# Patient Record
Sex: Male | Born: 1955 | Race: White | Hispanic: No | Marital: Married | Smoking: Former smoker
Health system: Southern US, Community
[De-identification: ages and names within clinical notes are randomized; demographics above are authoritative.]

## PROBLEM LIST (undated history)

## (undated) DIAGNOSIS — Z972 Presence of dental prosthetic device (complete) (partial): Secondary | ICD-10-CM

## (undated) DIAGNOSIS — E785 Hyperlipidemia, unspecified: Secondary | ICD-10-CM

## (undated) DIAGNOSIS — K219 Gastro-esophageal reflux disease without esophagitis: Secondary | ICD-10-CM

## (undated) DIAGNOSIS — Z973 Presence of spectacles and contact lenses: Secondary | ICD-10-CM

## (undated) DIAGNOSIS — Z8739 Personal history of other diseases of the musculoskeletal system and connective tissue: Secondary | ICD-10-CM

## (undated) HISTORY — DX: Presence of spectacles and contact lenses: Z97.3

## (undated) HISTORY — DX: Personal history of other diseases of the musculoskeletal system and connective tissue: Z87.39

## (undated) HISTORY — DX: Gastro-esophageal reflux disease without esophagitis: K21.9

## (undated) HISTORY — PX: NASAL SINUS SURGERY: SHX719

## (undated) HISTORY — DX: Presence of dental prosthetic device (complete) (partial): Z97.2

## (undated) HISTORY — PX: COLONOSCOPY: SHX174

## (undated) HISTORY — DX: Hyperlipidemia, unspecified: E78.5

---

## 2007-07-31 DIAGNOSIS — Z8739 Personal history of other diseases of the musculoskeletal system and connective tissue: Secondary | ICD-10-CM

## 2007-07-31 HISTORY — DX: Personal history of other diseases of the musculoskeletal system and connective tissue: Z87.39

## 2007-07-31 HISTORY — PX: LEG SURGERY: SHX1003

## 2012-06-20 ENCOUNTER — Encounter: Payer: Self-pay | Admitting: Medical

## 2012-06-20 ENCOUNTER — Ambulatory Visit (INDEPENDENT_AMBULATORY_CARE_PROVIDER_SITE_OTHER): Payer: BC Managed Care – PPO | Admitting: Medical

## 2012-06-20 VITALS — BP 120/80 | HR 84 | Temp 98.2°F | Resp 16 | Ht 68.0 in | Wt 185.0 lb

## 2012-06-20 DIAGNOSIS — J069 Acute upper respiratory infection, unspecified: Secondary | ICD-10-CM

## 2012-06-20 DIAGNOSIS — M25569 Pain in unspecified knee: Secondary | ICD-10-CM

## 2012-06-20 DIAGNOSIS — M25562 Pain in left knee: Secondary | ICD-10-CM

## 2012-06-20 NOTE — Progress Notes (Signed)
Subjective: Here as a new patient.  Moved here from [place] few years ago.  Originally from [place].    Here for c/o head congestion.  Was up in [place] visiting friends last week, and now in the last 4-5 days having head congestion, ear pressure, some yellow phlegm productive.  Dry cough at night, feels a little ill, some chest heaviness.  He notes hx/o sinus infections in remote past, but ultimately had sinus surgery, nasal polyps removed.  He was outside over the weekend cutting some wood.  Not sure if he has sinus infection or allergies.  He denies fever, NVD, SOB, wheezing, sneezing, runny nose.  Using Benadryl every 6 hours.   He also reports left knee pain.  He is active in [place] and [place].  Denies injury, but started having pain 1 mo ago.   Has seen local orthopedic recently, had xray.  He isn't sure what the diagnosis was, but was told that he may need MRI.  No specific instruction given.   Still having pain.  Past Medical History  Diagnosis Date  . GERD (gastroesophageal reflux disease)   . H/O necrotizing fascIItis 2009    right upper thigh   ROS as in HPI    Objective:   Physical Exam  Filed Vitals:   06/20/12 1357  BP: 120/80  Pulse: 84  Temp: 98.2 F (36.8 C)  Resp: 16    General appearance: alert, no distress, WD/WN HEENT: normocephalic, sclerae anicteric, right TM with serous fluid behind TM, left TM normal, nares with erythema and mild turbinate edema, pharynx with mild erythema Oral cavity: MMM, no lesions Neck: supple, no lymphadenopathy, no thyromegaly, no masses Heart: RRR, normal S1, S2, no murmurs Lungs: CTA bilaterally, no wheezes, rhonchi, or rales MSK: left knee tender along lateral superior knee, mild grinding with ROM, no obvious joint laxity, no swelling, no warmth, otherwise nontender, rest of LE unremarkable Neurovascularly intact   Assessment and Plan :    Encounter Diagnoses  Name Primary?  . Viral URI Yes  . Knee pain, left    Viral  URI - discussed supportive care, nasal saline, rest, hydrate well, can use Mucinex DM or Zyrtec/ benadryl but just once daily.  If not much improved by Monday, call back  Knee pain - possible patellofemoral syndrome vs other etiology.   discussed home therapy exercises.  Can use OTC fish oil or glucosamine.  If not improving in 2-3 wk, f/u with orthopedics.

## 2012-10-13 ENCOUNTER — Telehealth: Payer: Self-pay | Admitting: Family Medicine

## 2012-10-13 ENCOUNTER — Encounter: Payer: Self-pay | Admitting: Gastroenterology

## 2012-10-13 ENCOUNTER — Encounter: Payer: Self-pay | Admitting: Medical

## 2012-10-13 ENCOUNTER — Ambulatory Visit: Payer: BC Managed Care – PPO | Admitting: Medical

## 2012-10-13 VITALS — BP 120/70 | HR 68 | Temp 98.0°F | Resp 16 | Ht 68.0 in | Wt 190.0 lb

## 2012-10-13 DIAGNOSIS — Z8052 Family history of malignant neoplasm of bladder: Secondary | ICD-10-CM

## 2012-10-13 DIAGNOSIS — Z1211 Encounter for screening for malignant neoplasm of colon: Secondary | ICD-10-CM

## 2012-10-13 DIAGNOSIS — Z139 Encounter for screening, unspecified: Secondary | ICD-10-CM

## 2012-10-13 DIAGNOSIS — Z0000 Encounter for general adult medical examination without abnormal findings: Secondary | ICD-10-CM

## 2012-10-13 DIAGNOSIS — Z111 Encounter for screening for respiratory tuberculosis: Secondary | ICD-10-CM

## 2012-10-13 DIAGNOSIS — Z23 Encounter for immunization: Secondary | ICD-10-CM

## 2012-10-13 LAB — POCT URINALYSIS DIPSTICK
Bilirubin, UA: NEGATIVE
Blood, UA: NEGATIVE
Nitrite, UA: NEGATIVE
pH, UA: 5

## 2012-10-13 NOTE — Progress Notes (Signed)
Subjective:   HPI  Jared Coffey is a 57 y.o. male who presents for a complete physical and needs form completed.  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  Born in [place], but moved age 70yo to [place].  Lived in [place] for years.  Will be working at [employer].    Preventative care: Last ophthalmology visit: unsure Last dental visit:n/a  Last colonoscopy:unsure Last prostate exam: 2 years ago  Last EKG: never Last labs:n/a  Prior vaccinations: TD or Tdap:unsure Influenza:no/ never Pneumococcal:n/a Shingles/Zostavax:n/a  Advanced directive:n/a Health care power of attorney:n/a Living will:n/a  Concerns: none  Reviewed their medical, surgical, family, social, medication, and allergy history and updated chart as appropriate.   Past Medical History  Diagnosis Date  . GERD (gastroesophageal reflux disease)   . H/O necrotizing fascIItis 2009    right upper thigh    Past Surgical History  Procedure Laterality Date  . Leg surgery  2009    right upper leg, plantar fascitis  . Nasal sinus surgery      age 41yo    Family History  Problem Relation Age of Onset  . Stroke Mother   . Dementia Mother     vascular  . Cancer Father 50    died of bladder cancer  . Osteoporosis Sister   . Diabetes Neg Hx   . Heart disease Neg Hx   . Hypertension Neg Hx     History   Social History  . Marital Status: Married    Spouse Name: N/A    Number of Children: N/A  . Years of Education: N/A   Occupational History  . Not on file.   Social History Main Topics  . Smoking status: Former Smoker -- 0.50 packs/day for 15 years    Start date: 06/20/2004  . Smokeless tobacco: Not on file  . Alcohol Use: No  . Drug Use: No  . Sexually Active: Not on file   Other Topics Concern  . Not on file   Social History Narrative   Married, 2 children from ex wife,  exercise - 5 days per week, 40-50 minutes, kung fu, weight training.  Walk nightly.       No current outpatient prescriptions on file prior to visit.   No current facility-administered medications on file prior to visit.    Allergies  Allergen Reactions  . Penicillins      Review of Systems Constitutional: -fever, -chills, -sweats, -unexpected weight change, -decreased appetite, -fatigue Allergy: -sneezing, -itching, -congestion Dermatology: -changing moles, --rash, -lumps ENT: -runny nose, -ear pain, -sore throat, -hoarseness, -sinus pain, -teeth pain, - ringing in ears, -hearing loss, -nosebleeds Cardiology: -chest pain, -palpitations, -swelling, -difficulty breathing when lying flat, -waking up short of breath Respiratory: -cough, -shortness of breath, -difficulty breathing with exercise or exertion, -wheezing, -coughing up blood Gastroenterology: -abdominal pain, -nausea, -vomiting, -diarrhea, -constipation, -blood in stool, -changes in bowel movement, -difficulty swallowing or eating Hematology: -bleeding, -bruising  Musculoskeletal: -joint aches, -muscle aches, -joint swelling, -back pain, -neck pain, -cramping, -changes in gait Ophthalmology: denies vision changes, eye redness, itching, discharge Urology: -burning with urination, -difficulty urinating, -blood in urine, -urinary frequency, -urgency, -incontinence Neurology: -headache, -weakness, -tingling, -numbness, -memory loss, -falls, -dizziness Psychology: -depressed mood, -agitation, -sleep problems     Objective:   Physical Exam  Filed Vitals:   10/13/12 1119  BP: 120/70  Pulse: 68  Temp: 98 F (36.7  C)  Resp: 16    General appearance: alert, no distress, WD/WN Skin: few scattered bening lesions, no worrisome lesions HEENT: normocephalic, conjunctiva/corneas normal, sclerae anicteric, PERRLA, EOMi, nares patent, no discharge or erythema, pharynx normal Oral cavity: MMM, tongue normal, teeth in good  repair Neck: supple, no lymphadenopathy, no thyromegaly, no masses, normal ROM, no bruits Chest: non tender, normal shape and expansion Heart: RRR, normal S1, S2, no murmurs Lungs: CTA bilaterally, no wheezes, rhonchi, or rales Abdomen: +bs, soft, non tender, non distended, no masses, no hepatomegaly, no splenomegaly, no bruits Back: non tender, normal ROM, no scoliosis Musculoskeletal: upper extremities non tender, no obvious deformity, normal ROM throughout, lower extremities non tender, no obvious deformity, normal ROM throughout Extremities: no edema, no cyanosis, no clubbing Pulses: 2+ symmetric, upper and lower extremities, normal cap refill Neurological: alert, oriented x 3, CN2-12 intact, strength normal upper extremities and lower extremities, sensation normal throughout, DTRs 2+ throughout, no cerebellar signs, gait normal Psychiatric: normal affect, behavior normal, pleasant  GU: normal male external genitalia, nontender, no masses, no hernia, no lymphadenopathy Rectal: anus normal, prostate mildly enlarged, no nodules, occult negative stool   Assessment and Plan :    Encounter Diagnoses  Name Primary?  . Routine general medical examination at a health care facility Yes  . Need for Tdap vaccination   . Family history of bladder cancer   . Screening for condition   . Special screening for malignant neoplasms, colon      Physical exam - discussed healthy lifestyle, diet, exercise, preventative care, vaccinations, and addressed their concerns.  He will return fasting tomorrow for labs.  PPD placed.  No restrictions regarding employment.    Tdap vaccine, VIS and counseling given  Family history of bladder cancer.  Urinalysis today normal.  Titers for Hep B and MMR.  Will refer for 1st screening colonoscopy.  Follow-up pending labs

## 2012-10-13 NOTE — Telephone Encounter (Signed)
Patient is aware of his appointment at [facility] on May 1st,14 @ 1130 am. Nurse visit on 11/13/12. CLS [phone]

## 2012-10-13 NOTE — Patient Instructions (Signed)
Preventative Care for Adults, Male       REGULAR HEALTH EXAMS:  A routine yearly physical is a good way to check in with your primary care provider about your health and preventive screening. It is also an opportunity to share updates about your health and any concerns you have, and receive a thorough all-over exam.   Most health insurance companies pay for at least some preventative services.  Check with your health plan for specific coverages.  WHAT PREVENTATIVE SERVICES DO MEN NEED?  Adult men should have their weight and blood pressure checked regularly.   Men age 35 and older should have their cholesterol levels checked regularly.  Beginning at age 50 and continuing to age 75, men should be screened for colorectal cancer.  Certain people should may need continued testing until age 85.  Other cancer screening may include exams for testicular and prostate cancer.  Updating vaccinations is part of preventative care.  Vaccinations help protect against diseases such as the flu.  Lab tests are generally done as part of preventative care to screen for anemia and blood disorders, to screen for problems with the kidneys and liver, to screen for bladder problems, to check blood sugar, and to check your cholesterol level.  Preventative services generally include counseling about diet, exercise, avoiding tobacco, drugs, excessive alcohol consumption, and sexually transmitted infections.    GENERAL RECOMMENDATIONS FOR GOOD HEALTH:  Healthy diet:  Eat a variety of foods, including fruit, vegetables, animal or vegetable protein, such as meat, fish, chicken, and eggs, or beans, lentils, tofu, and grains, such as rice.  Drink plenty of water daily.  Decrease saturated fat in the diet, avoid lots of red meat, processed foods, sweets, fast foods, and fried foods.  Exercise:  Aerobic exercise helps maintain good heart health. At least 30-40 minutes of moderate-intensity exercise is recommended.  For example, a brisk walk that increases your heart rate and breathing. This should be done on most days of the week.   Find a type of exercise or a variety of exercises that you enjoy so that it becomes a part of your daily life.  Examples are running, walking, swimming, water aerobics, and biking.  For motivation and support, explore group exercise such as aerobic class, spin class, Zumba, Yoga,or  martial arts, etc.    Set exercise goals for yourself, such as a certain weight goal, walk or run in a race such as a 5k walk/run.  Speak to your primary care provider about exercise goals.  Disease prevention:  If you smoke or chew tobacco, find out from your caregiver how to quit. It can literally save your life, no matter how long you have been a tobacco user. If you do not use tobacco, never begin.   Maintain a healthy diet and normal weight. Increased weight leads to problems with blood pressure and diabetes.   The Body Mass Index or BMI is a way of measuring how much of your body is fat. Having a BMI above 27 increases the risk of heart disease, diabetes, hypertension, stroke and other problems related to obesity. Your caregiver can help determine your BMI and based on it develop an exercise and dietary program to help you achieve or maintain this important measurement at a healthful level.  High blood pressure causes heart and blood vessel problems.  Persistent high blood pressure should be treated with medicine if weight loss and exercise do not work.   Fat and cholesterol leaves deposits in your arteries   that can block them. This causes heart disease and vessel disease elsewhere in your body.  If your cholesterol is found to be high, or if you have heart disease or certain other medical conditions, then you may need to have your cholesterol monitored frequently and be treated with medication.   Ask if you should have a stress test if your history suggests this. A stress test is a test done on  a treadmill that looks for heart disease. This test can find disease prior to there being a problem.  Avoid drinking alcohol in excess (more than two drinks per day).  Avoid use of street drugs. Do not share needles with anyone. Ask for professional help if you need assistance or instructions on stopping the use of alcohol, cigarettes, and/or drugs.  Brush your teeth twice a day with fluoride toothpaste, and floss once a day. Good oral hygiene prevents tooth decay and gum disease. The problems can be painful, unattractive, and can cause other health problems. Visit your dentist for a routine oral and dental check up and preventive care every 6-12 months.   Look at your skin regularly.  Use a mirror to look at your back. Notify your caregivers of changes in moles, especially if there are changes in shapes, colors, a size larger than a pencil eraser, an irregular border, or development of new moles.  Safety:  Use seatbelts 100% of the time, whether driving or as a passenger.  Use safety devices such as hearing protection if you work in environments with loud noise or significant background noise.  Use safety glasses when doing any work that could send debris in to the eyes.  Use a helmet if you ride a bike or motorcycle.  Use appropriate safety gear for contact sports.  Talk to your caregiver about gun safety.  Use sunscreen with a SPF (or skin protection factor) of 15 or greater.  Lighter skinned people are at a greater risk of skin cancer. Don't forget to also wear sunglasses in order to protect your eyes from too much damaging sunlight. Damaging sunlight can accelerate cataract formation.   Practice safe sex. Use condoms. Condoms are used for birth control and to help reduce the spread of sexually transmitted infections (or STIs).  Some of the STIs are gonorrhea (the clap), chlamydia, syphilis, trichomonas, herpes, HPV (human papilloma virus) and HIV (human immunodeficiency virus) which causes AIDS.  The herpes, HIV and HPV are viral illnesses that have no cure. These can result in disability, cancer and death.   Keep carbon monoxide and smoke detectors in your home functioning at all times. Change the batteries every 6 months or use a model that plugs into the wall.   Vaccinations:  Stay up to date with your tetanus shots and other required immunizations. You should have a booster for tetanus every 10 years. Be sure to get your flu shot every year, since 5%-20% of the [place] population comes down with the flu. The flu vaccine changes each year, so being vaccinated once is not enough. Get your shot in the fall, before the flu season peaks.   Other vaccines to consider:  Pneumococcal vaccine to protect against certain types of pneumonia.  This is normally recommended for adults age 65 or older.  However, adults younger than 58 years old with certain underlying conditions such as diabetes, heart or lung disease should also receive the vaccine.  Shingles vaccine to protect against Varicella Zoster if you are older than age 60, or younger   than 58 years old with certain underlying illness.  Hepatitis A vaccine to protect against a form of infection of the liver by a virus acquired from food.  Hepatitis B vaccine to protect against a form of infection of the liver by a virus acquired from blood or body fluids, particularly if you work in health care.  If you plan to travel internationally, check with your local health department for specific vaccination recommendations.  Cancer Screening:  Most routine colon cancer screening begins at the age of 50. On a yearly basis, doctors may provide special easy to use take-home tests to check for hidden blood in the stool. Sigmoidoscopy or colonoscopy can detect the earliest forms of colon cancer and is life saving. These tests use a small camera at the end of a tube to directly examine the colon. Speak to your caregiver about this at age 50, when routine  screening begins (and is repeated every 5 years unless early forms of pre-cancerous polyps or small growths are found).   At the age of 50 men usually start screening for prostate cancer every year. Screening may begin at a younger age for those with higher risk. Those at higher risk include African-Americans or having a family history of prostate cancer. There are two types of tests for prostate cancer:   Prostate-specific antigen (PSA) testing. Recent studies raise questions about prostate cancer using PSA and you should discuss this with your caregiver.   Digital rectal exam (in which your doctor's lubricated and gloved finger feels for enlargement of the prostate through the anus).   Screening for testicular cancer.  Do a monthly exam of your testicles. Gently roll each testicle between your thumb and fingers, feeling for any abnormal lumps. The best time to do this is after a hot shower or bath when the tissues are looser. Notify your caregivers of any lumps, tenderness or changes in size or shape immediately.     

## 2012-10-15 ENCOUNTER — Other Ambulatory Visit: Payer: BC Managed Care – PPO

## 2012-10-15 DIAGNOSIS — Z139 Encounter for screening, unspecified: Secondary | ICD-10-CM

## 2012-10-15 DIAGNOSIS — Z0000 Encounter for general adult medical examination without abnormal findings: Secondary | ICD-10-CM

## 2012-10-15 LAB — COMPREHENSIVE METABOLIC PANEL
ALT: 25 U/L (ref 0–53)
Albumin: 4.3 g/dL (ref 3.5–5.2)
CO2: 28 mEq/L (ref 19–32)
Chloride: 105 mEq/L (ref 96–112)
Potassium: 4.3 mEq/L (ref 3.5–5.3)
Sodium: 141 mEq/L (ref 135–145)
Total Bilirubin: 0.5 mg/dL (ref 0.3–1.2)
Total Protein: 6.4 g/dL (ref 6.0–8.3)

## 2012-10-15 LAB — CBC WITH DIFFERENTIAL/PLATELET
Basophils Absolute: 0 10*3/uL (ref 0.0–0.1)
Eosinophils Absolute: 0.3 10*3/uL (ref 0.0–0.7)
Eosinophils Relative: 4 % (ref 0–5)
Lymphocytes Relative: 29 % (ref 12–46)
MCV: 84.3 fL (ref 78.0–100.0)
Platelets: 353 10*3/uL (ref 150–400)
RDW: 13.5 % (ref 11.5–15.5)
WBC: 6.3 10*3/uL (ref 4.0–10.5)

## 2012-10-16 LAB — HEPATITIS B SURFACE ANTIBODY, QUANTITATIVE: Hepatitis B-Post: 0.2 m[IU]/mL

## 2012-10-16 LAB — MEASLES/MUMPS/RUBELLA IMMUNITY: Rubella: 3.71 Index — ABNORMAL HIGH (ref <0.90)

## 2012-10-22 ENCOUNTER — Other Ambulatory Visit: Payer: Self-pay | Admitting: Medical

## 2012-10-22 MED ORDER — ATORVASTATIN CALCIUM 20 MG PO TABS: 20.0000 mg | ORAL_TABLET | Freq: Every day | ORAL | Status: DC

## 2012-10-23 ENCOUNTER — Telehealth: Payer: Self-pay | Admitting: Medical

## 2012-10-23 NOTE — Telephone Encounter (Signed)
LM

## 2012-10-28 ENCOUNTER — Other Ambulatory Visit (INDEPENDENT_AMBULATORY_CARE_PROVIDER_SITE_OTHER): Payer: BC Managed Care – PPO

## 2012-10-28 DIAGNOSIS — Z111 Encounter for screening for respiratory tuberculosis: Secondary | ICD-10-CM

## 2012-10-28 DIAGNOSIS — Z23 Encounter for immunization: Secondary | ICD-10-CM

## 2012-10-30 LAB — TB SKIN TEST
Induration: 0 mm
TB Skin Test: NEGATIVE

## 2012-10-30 NOTE — Addendum Note (Signed)
Addended by: Lavell Islam on: 10/30/2012 11:35 AM   Modules accepted: Orders

## 2012-11-13 ENCOUNTER — Encounter (HOSPITAL_COMMUNITY): Payer: Self-pay | Admitting: Emergency Medicine

## 2012-11-13 ENCOUNTER — Emergency Department (HOSPITAL_COMMUNITY)
Admission: EM | Admit: 2012-11-13 | Discharge: 2012-11-13 | Disposition: A | Discharge status: Home / Self Care | Payer: BC Managed Care – PPO | Attending: Emergency Medicine | Admitting: Emergency Medicine

## 2012-11-13 ENCOUNTER — Emergency Department (HOSPITAL_COMMUNITY): Payer: BC Managed Care – PPO

## 2012-11-13 DIAGNOSIS — Z7982 Long term (current) use of aspirin: Secondary | ICD-10-CM | POA: Insufficient documentation

## 2012-11-13 DIAGNOSIS — Z8739 Personal history of other diseases of the musculoskeletal system and connective tissue: Secondary | ICD-10-CM | POA: Insufficient documentation

## 2012-11-13 DIAGNOSIS — Z87891 Personal history of nicotine dependence: Secondary | ICD-10-CM | POA: Insufficient documentation

## 2012-11-13 DIAGNOSIS — F4321 Adjustment disorder with depressed mood: Secondary | ICD-10-CM | POA: Insufficient documentation

## 2012-11-13 DIAGNOSIS — K219 Gastro-esophageal reflux disease without esophagitis: Secondary | ICD-10-CM | POA: Insufficient documentation

## 2012-11-13 DIAGNOSIS — IMO0002 Reserved for concepts with insufficient information to code with codable children: Secondary | ICD-10-CM | POA: Insufficient documentation

## 2012-11-13 DIAGNOSIS — Y929 Unspecified place or not applicable: Secondary | ICD-10-CM | POA: Insufficient documentation

## 2012-11-13 DIAGNOSIS — R296 Repeated falls: Secondary | ICD-10-CM | POA: Insufficient documentation

## 2012-11-13 DIAGNOSIS — T148XXA Other injury of unspecified body region, initial encounter: Secondary | ICD-10-CM

## 2012-11-13 DIAGNOSIS — S161XXA Strain of muscle, fascia and tendon at neck level, initial encounter: Secondary | ICD-10-CM

## 2012-11-13 DIAGNOSIS — Z79899 Other long term (current) drug therapy: Secondary | ICD-10-CM | POA: Insufficient documentation

## 2012-11-13 DIAGNOSIS — Y939 Activity, unspecified: Secondary | ICD-10-CM | POA: Insufficient documentation

## 2012-11-13 DIAGNOSIS — S139XXA Sprain of joints and ligaments of unspecified parts of neck, initial encounter: Secondary | ICD-10-CM | POA: Insufficient documentation

## 2012-11-13 DIAGNOSIS — G40909 Epilepsy, unspecified, not intractable, without status epilepticus: Secondary | ICD-10-CM | POA: Insufficient documentation

## 2012-11-13 MED ORDER — BACITRACIN 500 UNIT/GM EX OINT
1.0000 "application " | TOPICAL_OINTMENT | Freq: Two times a day (BID) | CUTANEOUS | Status: DC
  Administered 2012-11-13: 1 via TOPICAL
  Filled 2012-11-13: qty 0.9
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
## 2012-11-17 ENCOUNTER — Telehealth: Payer: Self-pay | Admitting: Family Medicine

## 2012-11-17 NOTE — Telephone Encounter (Signed)
I called and left a message for the patient about calling and scheduling a ED follow up appointment. CLS

## 2012-11-27 ENCOUNTER — Other Ambulatory Visit: Payer: BC Managed Care – PPO

## 2012-11-27 ENCOUNTER — Encounter: Payer: BC Managed Care – PPO | Admitting: Gastroenterology

## 2012-11-27 ENCOUNTER — Other Ambulatory Visit (INDEPENDENT_AMBULATORY_CARE_PROVIDER_SITE_OTHER): Payer: BC Managed Care – PPO

## 2012-11-27 DIAGNOSIS — Z23 Encounter for immunization: Secondary | ICD-10-CM

## 2013-04-30 ENCOUNTER — Other Ambulatory Visit (INDEPENDENT_AMBULATORY_CARE_PROVIDER_SITE_OTHER): Payer: BC Managed Care – PPO

## 2013-04-30 DIAGNOSIS — Z23 Encounter for immunization: Secondary | ICD-10-CM

## 2013-06-03 ENCOUNTER — Ambulatory Visit: Payer: BC Managed Care – PPO | Admitting: Medical

## 2013-06-04 ENCOUNTER — Ambulatory Visit (INDEPENDENT_AMBULATORY_CARE_PROVIDER_SITE_OTHER): Payer: BC Managed Care – PPO | Admitting: Family Medicine

## 2013-06-04 VITALS — BP 128/78 | HR 57 | Wt 178.0 lb

## 2013-06-04 DIAGNOSIS — L309 Dermatitis, unspecified: Secondary | ICD-10-CM

## 2013-06-04 DIAGNOSIS — L259 Unspecified contact dermatitis, unspecified cause: Secondary | ICD-10-CM

## 2013-06-04 MED ORDER — TRIAMCINOLONE ACETONIDE 0.5 % EX CREA: 1.0000 "application " | TOPICAL_CREAM | Freq: Three times a day (TID) | CUTANEOUS | Status: DC

## 2013-06-04 NOTE — Progress Notes (Signed)
  Subjective:    Patient ID: Jared Coffey, male    DOB: 1955/09/23, 57 y.o.   MRN: [id number]  HPI He is here for evaluation of a rash and itching in the mid posterior back area that he has had for the last 15 months. It does tend to come and go. There is a correlation to when he showers and has difficulty with the itching. He notices it in no area other than his back. He did state he was given a patch which helped. He showed it to me however there are no markings on that.   Review of Systems     Objective:   Physical Exam Alert and in no distress. Exam of the back shows slight ecchymosis or he recently scratched the area but the skin otherwise appears normal      Assessment & Plan:  Dermatitis - Plan: triamcinolone cream (KENALOG) 0.5 %  the patch is probably a cortisone preparation. Recommend he use a patch of her try Kenalog. He has further difficulty I will refer to dermatology.

## 2014-05-25 ENCOUNTER — Ambulatory Visit (INDEPENDENT_AMBULATORY_CARE_PROVIDER_SITE_OTHER): Payer: BC Managed Care – PPO | Admitting: Medical

## 2014-05-25 ENCOUNTER — Encounter: Payer: Self-pay | Admitting: Internal Medicine

## 2014-05-25 ENCOUNTER — Encounter: Payer: Self-pay | Admitting: Medical

## 2014-05-25 VITALS — BP 100/60 | HR 60 | Temp 98.2°F | Resp 14 | Ht 68.0 in | Wt 176.0 lb

## 2014-05-25 DIAGNOSIS — Z0000 Encounter for general adult medical examination without abnormal findings: Secondary | ICD-10-CM

## 2014-05-25 DIAGNOSIS — Z23 Encounter for immunization: Secondary | ICD-10-CM

## 2014-05-25 DIAGNOSIS — Z1211 Encounter for screening for malignant neoplasm of colon: Secondary | ICD-10-CM

## 2014-05-25 DIAGNOSIS — Z125 Encounter for screening for malignant neoplasm of prostate: Secondary | ICD-10-CM

## 2014-05-25 DIAGNOSIS — K219 Gastro-esophageal reflux disease without esophagitis: Secondary | ICD-10-CM

## 2014-05-25 LAB — COMPREHENSIVE METABOLIC PANEL
ALBUMIN: 4.5 g/dL (ref 3.5–5.2)
ALT: 24 U/L (ref 0–53)
AST: 22 U/L (ref 0–37)
Alkaline Phosphatase: 64 U/L (ref 39–117)
BUN: 16 mg/dL (ref 6–23)
CHLORIDE: 103 meq/L (ref 96–112)
CO2: 29 meq/L (ref 19–32)
Calcium: 9.7 mg/dL (ref 8.4–10.5)
Creat: 1.04 mg/dL (ref 0.50–1.35)
GLUCOSE: 83 mg/dL (ref 70–99)
POTASSIUM: 4.3 meq/L (ref 3.5–5.3)
SODIUM: 141 meq/L (ref 135–145)
TOTAL PROTEIN: 7.4 g/dL (ref 6.0–8.3)
Total Bilirubin: 0.8 mg/dL (ref 0.2–1.2)

## 2014-05-25 LAB — CBC
HEMATOCRIT: 44.6 % (ref 39.0–52.0)
HEMOGLOBIN: 15.6 g/dL (ref 13.0–17.0)
MCH: 30.1 pg (ref 26.0–34.0)
MCHC: 35 g/dL (ref 30.0–36.0)
MCV: 85.9 fL (ref 78.0–100.0)
Platelets: 356 10*3/uL (ref 150–400)
RBC: 5.19 MIL/uL (ref 4.22–5.81)
RDW: 13.2 % (ref 11.5–15.5)
WBC: 6.4 10*3/uL (ref 4.0–10.5)

## 2014-05-25 LAB — LIPID PANEL
CHOLESTEROL: 227 mg/dL — AB (ref 0–200)
HDL: 61 mg/dL (ref >39)
LDL Cholesterol: 137 mg/dL — ABNORMAL HIGH (ref 0–99)
TRIGLYCERIDES: 145 mg/dL (ref <150)
Total CHOL/HDL Ratio: 3.7 Ratio
VLDL: 29 mg/dL (ref 0–40)

## 2014-05-25 LAB — POCT URINALYSIS DIPSTICK
BILIRUBIN UA: NEGATIVE
Blood, UA: NEGATIVE
GLUCOSE UA: NEGATIVE
KETONES UA: NEGATIVE
LEUKOCYTES UA: NEGATIVE
Nitrite, UA: NEGATIVE
Protein, UA: NEGATIVE
Urobilinogen, UA: NEGATIVE
pH, UA: 5

## 2014-05-25 LAB — TSH: TSH: 1.734 u[IU]/mL (ref 0.350–4.500)

## 2014-05-25 NOTE — Progress Notes (Signed)
Subjective:   HPI  Jared CornwallFariborz Coffey is a 58 y.o. male who presents for a complete physical.   Preventative care: Last ophthalmology visit:yes Last dental visit: yes Last colonoscopy:never Last prostate exam: ? Last EKG:n/a Last labs: today  Prior vaccinations: TD or Tdap: 2014 Influenza: today Pneumococcal:n/a Shingles/Zostavax:n/a  Advanced directive:n/a Health care power of attorney:n/a Living will:n/a  Concerns: none  Past Medical History  Diagnosis Date  . GERD (gastroesophageal reflux disease)   . H/O necrotizing fasciitis 2009    right upper thigh  . Wears glasses   . Wears partial dentures     Past Surgical History  Procedure Laterality Date  . Leg surgery  2009    right upper leg, plantar fascitis  . Nasal sinus surgery      age 58yo  . Colonoscopy      pending 04/2014    History   Social History  . Marital Status: Married    Spouse Name: N/A    Number of Children: N/A  . Years of Education: N/A   Occupational History  . Not on file.   Social History Main Topics  . Smoking status: Former Smoker -- 0.50 packs/day for 15 years    Start date: 06/20/2004  . Smokeless tobacco: Not on file  . Alcohol Use: No  . Drug Use: No  . Sexual Activity: Not on file   Other Topics Concern  . Not on file   Social History Narrative   Married, 2 children from ex wife, exercise - 5 days per week, 40-50 minutes, kung fu, weight training.  Walk nightly.   Working as a Midwifebus driver for [employer]    Family History  Problem Relation Age of Onset  . Stroke Mother   . Dementia Mother     vascular  . Cancer Father [id number]    died of bladder cancer  . Osteoporosis Sister   . Diabetes Neg Hx   . Heart disease Neg Hx   . Hypertension Neg Hx     Current outpatient prescriptions:aspirin EC 81 MG tablet, Take 81 mg by mouth daily., Disp: , Rfl: ;  omeprazole (PRILOSEC) 10 MG capsule, Take 10 mg by mouth daily., Disp: , Rfl:   Allergies  Allergen Reactions  .  Penicillins Rash   Reviewed their medical, surgical, family, social, medication, and allergy history and updated chart as appropriate.    Review of Systems Constitutional: -fever, -chills, -sweats, -unexpected weight change, -decreased appetite, -fatigue Allergy: -sneezing, -itching, -congestion Dermatology: -changing moles, --rash, -lumps ENT: -runny nose, -ear pain, -sore throat, -hoarseness, -sinus pain, -teeth pain, - ringing in ears, -hearing loss, -nosebleeds Cardiology: -chest pain, -palpitations, -swelling, -difficulty breathing when lying flat, -waking up short of breath Respiratory: -cough, -shortness of breath, -difficulty breathing with exercise or exertion, -wheezing, -coughing up blood Gastroenterology: -abdominal pain, -nausea, -vomiting, -diarrhea, -constipation, -blood in stool, -changes in bowel movement, -difficulty swallowing or eating Hematology: -bleeding, -bruising  Musculoskeletal: -joint aches, -muscle aches, -joint swelling, -back pain, -neck pain, -cramping, -changes in gait Ophthalmology: denies vision changes, eye redness, itching, discharge Urology: -burning with urination, -difficulty urinating, -blood in urine, -urinary frequency, -urgency, -incontinence Neurology: -headache, -weakness, -tingling, -numbness, -memory loss, -falls, -dizziness Psychology: -depressed mood, -agitation, -sleep problems     Objective:   Physical Exam  BP 100/60  Pulse 60  Temp(Src) 98.2 F (36.8 C) (Oral)  Resp 14  Ht 5\' 8"  (1.727 m)  Wt 176 lb (79.833 kg)  BMI 26.77 kg/m2  General appearance: alert,  no distress, WD/WN  Skin: few scattered benign lesions, no worrisome lesions  HEENT: normocephalic, conjunctiva/corneas normal, sclerae anicteric, PERRLA, EOMi, nares patent, no discharge or erythema, pharynx normal  Oral cavity: MMM, tongue normal, teeth in good repair, partial denture present Neck: supple, no lymphadenopathy, no thyromegaly, no masses, normal ROM, no  bruits  Chest: non tender, normal shape and expansion  Heart: RRR, normal S1, S2, no murmurs  Lungs: CTA bilaterally, no wheezes, rhonchi, or rales  Abdomen: +bs, soft, non tender, non distended, no masses, no hepatomegaly, no splenomegaly, no bruits  Back: non tender, normal ROM, no scoliosis  Musculoskeletal: upper extremities non tender, no obvious deformity, normal ROM throughout, lower extremities non tender, no obvious deformity, normal ROM throughout  Extremities: no edema, no cyanosis, no clubbing  Pulses: 2+ symmetric, upper and lower extremities, normal cap refill  Neurological: medial upper right thigh with long linear surgical scar, otherwise alert, oriented x 3, CN2-12 intact, strength normal upper extremities and lower extremities, sensation normal throughout, DTRs 2+ throughout, no cerebellar signs, gait normal  Psychiatric: normal affect, behavior normal, pleasant  GU: normal male external genitalia, circumcised, left distal dorsal shaft of penis with 4mm x 2mm flat brown/black macule and smaller 1mm round similar macules distal to that unchanged for years per patient, nontender, no masses, no hernia, no lymphadenopathy  Rectal: anus normal, prostate mildly enlarged, no nodules, occult negative stool  Assessment and Plan :    Encounter Diagnoses  Name Primary?  . Encounter for health maintenance examination in adult Yes  . Gastroesophageal reflux disease without esophagitis   . Need for prophylactic vaccination and inoculation against influenza   . Screening for prostate cancer   . Special screening for malignant neoplasms, colon     Physical exam - discussed healthy lifestyle, diet, exercise, preventative care, vaccinations, and addressed their concerns.  Labs today See your eye doctor yearly for routine vision care. See your dentist yearly for routine dental care including hygiene visits twice yearly. GERD-uses PPI periodically Counseled on the influenza virus vaccine.   Vaccine information sheet given.  Influenza vaccine given after consent obtained. PSA screening today Referral for first colonoscopy screening Follow-up pending labs

## 2014-05-26 ENCOUNTER — Encounter: Payer: Self-pay | Admitting: Family Medicine

## 2014-05-26 LAB — PSA: PSA: 0.82 ng/mL (ref <=4.00)

## 2014-06-28 ENCOUNTER — Other Ambulatory Visit (INDEPENDENT_AMBULATORY_CARE_PROVIDER_SITE_OTHER): Payer: BC Managed Care – PPO

## 2014-06-28 DIAGNOSIS — Z111 Encounter for screening for respiratory tuberculosis: Secondary | ICD-10-CM

## 2014-07-02 ENCOUNTER — Encounter: Payer: Self-pay | Admitting: *Deleted

## 2014-07-02 ENCOUNTER — Ambulatory Visit (AMBULATORY_SURGERY_CENTER): Payer: Self-pay | Admitting: *Deleted

## 2014-07-02 VITALS — Ht 68.0 in | Wt 184.0 lb

## 2014-07-02 DIAGNOSIS — Z1211 Encounter for screening for malignant neoplasm of colon: Secondary | ICD-10-CM

## 2014-07-02 MED ORDER — MOVIPREP 100 G PO SOLR: 1.0000 | Freq: Once | ORAL | Status: DC

## 2014-07-02 NOTE — Progress Notes (Signed)
No egg or soy allergy. ewm No issues with past sedation. ewm No diet pills, no blood thinners. ewm No home 02 use. ewm Pt told he cannot drive the day of the procedure and he said he will just take a cab. Explained to pt he HAS to have a care partner here with him for the ENTIRE procedure and if he comes alone, we cannot do his procedure. He was also told he cannot take a cab home alone with no one with him. He was told multiple times he needs someone here, in the waiting room for 2-3 hours, that will not leave and be able to drive him home. He verbalized understanding of this. He was also confused about eating after I explained a light breakfast the day before. He thought he could have a light breakfast the day before  and the day of his procedure. Explained that the light breakfast is only the day before and the day of he gets only liquids until after his procedure. [facility] He stated that he finally understood the instructions. EM

## 2014-07-12 ENCOUNTER — Encounter: Payer: BC Managed Care – PPO | Admitting: Internal Medicine

## 2014-07-20 ENCOUNTER — Encounter: Payer: Self-pay | Admitting: Internal Medicine

## 2014-08-02 ENCOUNTER — Encounter: Payer: BC Managed Care – PPO | Admitting: Internal Medicine

## 2014-12-13 ENCOUNTER — Ambulatory Visit (INDEPENDENT_AMBULATORY_CARE_PROVIDER_SITE_OTHER): Payer: BLUE CROSS/BLUE SHIELD | Admitting: Family Medicine

## 2014-12-13 ENCOUNTER — Encounter: Payer: Self-pay | Admitting: Family Medicine

## 2014-12-13 VITALS — BP 140/70 | HR 62 | Temp 98.7°F | Wt 183.4 lb

## 2014-12-13 DIAGNOSIS — H109 Unspecified conjunctivitis: Secondary | ICD-10-CM

## 2014-12-13 DIAGNOSIS — J0100 Acute maxillary sinusitis, unspecified: Secondary | ICD-10-CM | POA: Diagnosis not present

## 2014-12-13 MED ORDER — CLARITHROMYCIN 500 MG PO TABS: 500.0000 mg | ORAL_TABLET | Freq: Two times a day (BID) | ORAL | Status: DC

## 2014-12-13 NOTE — Progress Notes (Signed)
   Subjective:    Patient ID: Jared Coffey, male    DOB: 07-Jun-1956, 59 y.o.   MRN: [id number]  HPI He complains of a six-day history of same with sinus congestion. This occurred after he did some yard work and moved some wheeze. He then developed some left eye redness and slight drainage. He continues to have difficulty with sinus congestion, PND, sore throat and slight headache.   Review of Systems     Objective:   Physical Exam Alert and in no distress. Tympanic membranes and canals are normal. Pharyngeal area is normal. Neck is supple without adenopathy or thyromegaly. Cardiac exam shows a regular sinus rhythm without murmurs or gallops. Lungs are clear to auscultation.Left eyelid is slightly swollen and the conjunctiva is injected. Nasal mucosa is red with some tenderness over maxillary sinuses        Assessment & Plan:  Conjunctivitis of left eye - Plan: clarithromycin (BIAXIN) 500 MG tablet  Acute maxillary sinusitis, recurrence not specified - Plan: clarithromycin (BIAXIN) 500 MG tablet He will call me if not entirely better when he finishes the antibiotic

## 2014-12-13 NOTE — Patient Instructions (Signed)
Take all the antibiotic and if not totally back to normal when you finish, give me a call 

## 2014-12-24 ENCOUNTER — Telehealth: Payer: Self-pay | Admitting: Family Medicine

## 2014-12-24 NOTE — Telephone Encounter (Signed)
Patient only having burning in his throat, no other symptoms. He will get some IBU and use chloraseptic spray until he sees Dr.Lalonde on Tuesday.

## 2014-12-24 NOTE — Telephone Encounter (Signed)
He was treated for a sinus infection. He did not have pharyngitis.  If only complaining of sore throat, he can try tylenol, ibuprofen, chloraseptic spray, throat lozenges. If he is having a lot of postnasal drainage, he can use cold medications to dry it up and prevent postnasal drainage. If he has thick mucus, he should be using guaifenesin (robitussin or mucinex). If he has allergies (clear runny nose, sniffling, sneezing, etc) then he should be taking antihistamine (ie claritin, zyrtec--I believe he has benadryl listed in his med list, not sure if he takes).

## 2014-12-24 NOTE — Telephone Encounter (Signed)
Pt. Called in stating the medication prescribed by Dr. Susann GivensLalonde CLARITHROMYCIN (biaxin) 500mg  tab. has not worked. Pt. states he has completed the ten day antibiotic and feels the same(throat burning). Pt. Has already made a f/u appt. With Dr. Susann GivensLalonde on Wednesday. Pt. 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   Contact # [phone]

## 2014-12-28 ENCOUNTER — Telehealth: Payer: Self-pay | Admitting: Medical

## 2014-12-28 ENCOUNTER — Other Ambulatory Visit: Payer: Self-pay | Admitting: Family Medicine

## 2014-12-28 ENCOUNTER — Encounter: Payer: Self-pay | Admitting: Medical

## 2014-12-28 ENCOUNTER — Ambulatory Visit (INDEPENDENT_AMBULATORY_CARE_PROVIDER_SITE_OTHER): Payer: BLUE CROSS/BLUE SHIELD | Admitting: Medical

## 2014-12-28 VITALS — BP 140/80 | HR 63 | Temp 98.2°F | Resp 15 | Wt 181.0 lb

## 2014-12-28 DIAGNOSIS — K121 Other forms of stomatitis: Secondary | ICD-10-CM | POA: Diagnosis not present

## 2014-12-28 DIAGNOSIS — L290 Pruritus ani: Secondary | ICD-10-CM

## 2014-12-28 DIAGNOSIS — Z1211 Encounter for screening for malignant neoplasm of colon: Secondary | ICD-10-CM

## 2014-12-28 MED ORDER — HYDROCORTISONE ACETATE 25 MG RE SUPP: 25.0000 mg | Freq: Two times a day (BID) | RECTAL | Status: DC

## 2014-12-28 MED ORDER — HYDROCORTISONE 2.5 % RE CREA: 1.0000 "application " | TOPICAL_CREAM | Freq: Two times a day (BID) | RECTAL | Status: DC

## 2014-12-28 NOTE — Telephone Encounter (Signed)
Refer again to GI for first screening colonoscopy

## 2014-12-28 NOTE — Progress Notes (Signed)
Subjective: Here for possible allergic reaction.   5 days ago ate some onion, whole mouth was burning, had blisters in mouth and throat. Couldn't eat or drink anything.  Thought this was allergic reaction.  Took benadryl and Claritin the next few days.  Everything in mouth was red.   At the same, same day, anus was itching.  Does eat a lot of tomatoes in general.  Had some beef stew along with the onions.  Denies recent fever, no blood in stool, no mucous in stool, no oily stool, no abdominal pain.  No prior similar.  improving significantly as of today, but anus still irritated and mouth ulcers not completley healed up yet.  No other aggravating or relieving factors. No other complaint.   Objective: BP 140/80 mmHg  Pulse 63  Temp(Src) 98.2 F (36.8 C) (Oral)  Resp 15  Wt 181 lb (82.101 kg)  General appearance: alert, no distress, WD/WN HEENT: normocephalic, sclerae anicteric, TMs pearly, nares patent, no discharge or erythema, pharynx normal Oral cavity: MMM, several whitish healing 2-573mm round ulcerations in oral mucosa throughout Neck: supple, no lymphadenopathy, no thyromegaly, no masses Abdomen: +bs, soft, non tender, non distended, no masses, no hepatomegaly, no splenomegaly Anus mildly inflamed appearing, no hemorrhoid, no fissure, nontender    Assessment: Encounter Diagnoses  Name Primary?  . Mouth ulcers Yes  . Rectal itching   . Special screening for malignant neoplasms, colon     Plan: Mouth ulcers, itching rectally - could be food allergen, but can't rule out other GI etiology.   Advised he use salt water oral rinse and spit BID,hydrate well, stop any suspected foods, and c/t Claritin in the morning, benadryl at night the next few days.   After antihistamine, then return for food allergy serum test.    Referral to GI as he is past due for screening colonoscopy in general.

## 2014-12-28 NOTE — Telephone Encounter (Signed)
[facility]

## 2014-12-28 NOTE — Patient Instructions (Signed)
Recommendations  Use the rectal cream 2-3 times daily the next few days  Use the rectal suppository twice daily for the next 3- 5 days until feeling much improved  Avoid suspected foods such as onions and tomatoes for now  Use Claritin in the morning, benadryl at night for 3-5 days  Once you have stopped the benadryl and Claritin, wait 72 hours and then come in for lab for allergy test   We will refer you for colonoscopy

## 2014-12-29 ENCOUNTER — Ambulatory Visit: Payer: Self-pay | Admitting: Family Medicine

## 2015-10-21 ENCOUNTER — Ambulatory Visit (INDEPENDENT_AMBULATORY_CARE_PROVIDER_SITE_OTHER): Payer: Managed Care, Other (non HMO) | Admitting: Medical

## 2015-10-21 ENCOUNTER — Encounter: Payer: Self-pay | Admitting: Medical

## 2015-10-21 VITALS — BP 136/80 | HR 56 | Ht 67.75 in | Wt 173.0 lb

## 2015-10-21 DIAGNOSIS — K219 Gastro-esophageal reflux disease without esophagitis: Secondary | ICD-10-CM | POA: Insufficient documentation

## 2015-10-21 DIAGNOSIS — L309 Dermatitis, unspecified: Secondary | ICD-10-CM | POA: Diagnosis not present

## 2015-10-21 DIAGNOSIS — Z0000 Encounter for general adult medical examination without abnormal findings: Secondary | ICD-10-CM

## 2015-10-21 DIAGNOSIS — E785 Hyperlipidemia, unspecified: Secondary | ICD-10-CM | POA: Diagnosis not present

## 2015-10-21 DIAGNOSIS — Z1211 Encounter for screening for malignant neoplasm of colon: Secondary | ICD-10-CM | POA: Diagnosis not present

## 2015-10-21 DIAGNOSIS — L299 Pruritus, unspecified: Secondary | ICD-10-CM

## 2015-10-21 DIAGNOSIS — Z7185 Encounter for immunization safety counseling: Secondary | ICD-10-CM

## 2015-10-21 DIAGNOSIS — Z7189 Other specified counseling: Secondary | ICD-10-CM

## 2015-10-21 DIAGNOSIS — Z125 Encounter for screening for malignant neoplasm of prostate: Secondary | ICD-10-CM | POA: Insufficient documentation

## 2015-10-21 LAB — LIPID PANEL
CHOLESTEROL: 208 mg/dL — AB (ref 125–200)
HDL: 60 mg/dL (ref >=40)
LDL Cholesterol: 141 mg/dL — ABNORMAL HIGH (ref <130)
TRIGLYCERIDES: 37 mg/dL (ref <150)
Total CHOL/HDL Ratio: 3.5 Ratio (ref <=5.0)
VLDL: 7 mg/dL (ref <30)

## 2015-10-21 LAB — COMPREHENSIVE METABOLIC PANEL
ALBUMIN: 4 g/dL (ref 3.6–5.1)
ALK PHOS: 59 U/L (ref 40–115)
ALT: 17 U/L (ref 9–46)
AST: 15 U/L (ref 10–35)
BUN: 17 mg/dL (ref 7–25)
CALCIUM: 9.3 mg/dL (ref 8.6–10.3)
CO2: 26 mmol/L (ref 20–31)
Chloride: 105 mmol/L (ref 98–110)
Creat: 0.94 mg/dL (ref 0.70–1.25)
Glucose, Bld: 78 mg/dL (ref 65–99)
POTASSIUM: 4.2 mmol/L (ref 3.5–5.3)
Sodium: 140 mmol/L (ref 135–146)
TOTAL PROTEIN: 6.6 g/dL (ref 6.1–8.1)
Total Bilirubin: 0.7 mg/dL (ref 0.2–1.2)

## 2015-10-21 LAB — CBC
HCT: 42.3 % (ref 39.0–52.0)
HEMOGLOBIN: 15 g/dL (ref 13.0–17.0)
MCH: 31.5 pg (ref 26.0–34.0)
MCHC: 35.5 g/dL (ref 30.0–36.0)
MCV: 88.9 fL (ref 78.0–100.0)
MPV: 8.9 fL (ref 8.6–12.4)
Platelets: 322 10*3/uL (ref 150–400)
RBC: 4.76 MIL/uL (ref 4.22–5.81)
RDW: 13 % (ref 11.5–15.5)
WBC: 5.8 10*3/uL (ref 4.0–10.5)

## 2015-10-21 LAB — POCT URINALYSIS DIPSTICK
BILIRUBIN UA: NEGATIVE
Glucose, UA: NEGATIVE
Ketones, UA: NEGATIVE
Leukocytes, UA: NEGATIVE
NITRITE UA: NEGATIVE
PH UA: 6
Protein, UA: NEGATIVE
RBC UA: NEGATIVE
Spec Grav, UA: >=1.03
UROBILINOGEN UA: NEGATIVE

## 2015-10-21 MED ORDER — TRIAMCINOLONE ACETONIDE 0.1 % EX CREA: 1.0000 "application " | TOPICAL_CREAM | Freq: Two times a day (BID) | CUTANEOUS | Status: DC

## 2015-10-21 NOTE — Progress Notes (Signed)
Subjective:   HPI  Jared Coffey is a 60 y.o. male who presents for a complete physical.   Concerns: none  Past Medical History  Diagnosis Date  . GERD (gastroesophageal reflux disease)   . H/O necrotizing fasciitis 2009    right upper thigh  . Wears glasses   . Wears partial dentures   . Hyperlipidemia     lower with diet changes    Past Surgical History  Procedure Laterality Date  . Leg surgery  2009    right upper leg,necrotizing fascitis  . Nasal sinus surgery      age 11yo  . Colonoscopy      declines, will refer for Cologuard 09/2015    Social History   Social History  . Marital Status: Married    Spouse Name: N/A  . Number of Children: N/A  . Years of Education: N/A   Occupational History  . Not on file.   Social History Main Topics  . Smoking status: Former Smoker -- 0.50 packs/day for 15 years    Start date: 06/20/2004  . Smokeless tobacco: Never Used  . Alcohol Use: No  . Drug Use: No  . Sexual Activity: Not on file   Other Topics Concern  . Not on file   Social History Narrative   Married, 2 children from ex wife, exercise - 5 days per week, 40-50 minutes, kung fu, weight training.  Walk nightly.   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[employer]    Family History  Problem Relation Age of Onset  . Stroke Mother   . Dementia Mother     vascular  . Cancer Father 53    died of bladder cancer  . Osteoporosis Sister   . Diabetes Neg Hx   . Heart disease Neg Hx   . Hypertension Neg Hx   . Colon cancer Neg Hx      Current outpatient prescriptions:  .  aspirin EC 81 MG tablet, Take 81 mg by mouth daily. Reported on 10/21/2015, Disp: , Rfl:  .  omeprazole (PRILOSEC) 10 MG capsule, Take 10 mg by mouth daily. Reported on 10/21/2015, Disp: , Rfl:  .  triamcinolone cream (KENALOG) 0.1 %, Apply 1 application topically 2 (two) times daily., Disp: 456 g, Rfl: 0  Allergies  Allergen Reactions  . Penicillins Rash    Reviewed their medical, surgical, family, social, medication, and allergy history and updated chart as appropriate.  Review of Systems Constitutional: -fever, -chills, -sweats, -unexpected weight change, -decreased appetite, -fatigue Allergy: -sneezing, -itching, -congestion Dermatology: -changing moles, --rash, -lumps ENT: -runny nose, -ear pain, -sore throat, -hoarseness, -sinus pain, -teeth pain, - ringing in ears, -hearing loss, -nosebleeds Cardiology: -chest pain, -palpitations, -swelling, -difficulty breathing when lying flat, -waking up short of breath Respiratory: -cough, -shortness of breath, -difficulty breathing with exercise or exertion, -wheezing, -coughing up blood Gastroenterology: -abdominal pain, -nausea, -vomiting, -diarrhea, -constipation, -blood in stool, -changes in bowel movement, -difficulty swallowing or eating Hematology: -bleeding, -bruising  Musculoskeletal: -joint aches, -muscle aches, -joint swelling, -back pain, -neck pain, -cramping, -changes in gait Ophthalmology: denies vision changes, eye redness, itching, discharge Urology: -burning with urination, -difficulty urinating, -blood in urine, -urinary frequency, -urgency, -incontinence Neurology: -headache, -weakness, -tingling, -numbness, -memory loss, -falls, -dizziness Psychology: -depressed mood, -agitation, -sleep problems     Objective:   Physical Exam  BP 136/80 mmHg  Pulse 56  Ht 5' 7.75" (1.721 m)  Wt 173 lb (78.472 kg)  BMI 26.49  kg/m2  General appearance: alert, no distress, WD/WN, [place] male Skin: few scattered benign lesions, no worrisome lesions  HEENT: normocephalic, conjunctiva/corneas normal, sclerae anicteric, PERRLA, EOMi, nares patent, no discharge or erythema, pharynx normal  Oral cavity: MMM, tongue normal, teeth in good repair, partial denture present Neck: supple, no lymphadenopathy, no thyromegaly, no masses, normal ROM, no bruits  Chest: non tender, normal shape  and expansion  Heart: RRR, normal S1, S2, no murmurs  Lungs: CTA bilaterally, no wheezes, rhonchi, or rales  Abdomen: +bs, soft, non tender, non distended, no masses, no hepatomegaly, no splenomegaly, no bruits  Back: non tender, normal ROM, no scoliosis  Musculoskeletal: upper extremities non tender, no obvious deformity, normal ROM throughout, lower extremities non tender, no obvious deformity, normal ROM throughout  Extremities: no edema, no cyanosis, no clubbing  Pulses: 2+ symmetric, upper and lower extremities, normal cap refill  Neurological: medial upper right thigh with long linear surgical scar, otherwise alert, oriented x 3, CN2-12 intact, strength normal upper extremities and lower extremities, sensation normal throughout, DTRs 2+ throughout, no cerebellar signs, gait normal  Psychiatric: normal affect, behavior normal, pleasant  GU: normal male external genitalia, circumcised, left distal dorsal shaft of penis with 4mm x 2mm flat brown/black macule and smaller 1mm round similar macules distal to that unchanged for years per patient, nontender, no masses, no hernia, no lymphadenopathy  Rectal: anus normal, prostate mildly enlarged, no nodules, occult negative stool   Adult ECG Report  Indication: physical, hyperlipidemia  Rate: 56 bpm  Rhythm: sinus bradycardia  QRS Axis: 4 degrees  PR Interval: 182ms  QRS Duration: 100ms  QTc: 420ms  Conduction Disturbances: none  Other Abnormalities: none  Patient's cardiac risk factors are: advanced age (older than 4555 for men, 5265 for women), dyslipidemia and male gender.  EKG comparison: none  Narrative Interpretation: sinus bradycardia, otherwise normal EKG     Assessment and Plan :    Encounter Diagnoses  Name Primary?  . Encounter for health maintenance examination in adult   . Hyperlipidemia   . Gastroesophageal reflux disease without esophagitis   . Special screening for malignant neoplasms, colon   . Screening for prostate  cancer   . Vaccine counseling Yes  . Dermatitis   . Pruritic condition    Physical exam - discussed healthy lifestyle, diet, exercise, preventative care, vaccinations, and addressed their concerns.  Labs today See your eye doctor yearly for routine vision care. See your dentist yearly for routine dental care including hygiene visits twice yearly. Hyperlipidemia - labs today fasting. GERD-uses PPI periodically PSA screening today Referral for cologuard Follow-up pending labs

## 2015-10-21 NOTE — Addendum Note (Signed)
Addended by: Kieth BrightlyLAWSON, Aydrien Froman M on: 10/21/2015 09:21 AM   Modules accepted: Orders, SmartSet

## 2015-10-22 LAB — PSA: PSA: 0.77 ng/mL (ref <=4.00)

## 2015-10-24 ENCOUNTER — Other Ambulatory Visit: Payer: Self-pay | Admitting: Medical

## 2015-10-24 MED ORDER — PRAVASTATIN SODIUM 20 MG PO TABS: 20.0000 mg | ORAL_TABLET | Freq: Every day | ORAL | Status: DC

## 2015-10-28 ENCOUNTER — Encounter: Payer: BLUE CROSS/BLUE SHIELD | Admitting: Medical

## 2016-01-12 ENCOUNTER — Telehealth: Payer: Self-pay | Admitting: Internal Medicine

## 2016-01-12 ENCOUNTER — Other Ambulatory Visit: Payer: Self-pay | Admitting: Medical

## 2016-01-12 DIAGNOSIS — E785 Hyperlipidemia, unspecified: Secondary | ICD-10-CM

## 2016-01-12 NOTE — Telephone Encounter (Signed)
It typically is a visit with labs, but he can come in for fasting labs first. Orders are in the system, and verify he is actually taking the medication correct?

## 2016-01-12 NOTE — Telephone Encounter (Signed)
Pt is coming in Monday morning for labs. Pt is taking med daily

## 2016-01-12 NOTE — Telephone Encounter (Signed)
Pt called and states he is almost out of his cholesterol. i see you wanted patient to come back in 3 months for follow-up but he states it for fasting lab work and not appt. Pt wants to know can he just get lab work instead

## 2016-01-16 ENCOUNTER — Other Ambulatory Visit: Payer: Managed Care, Other (non HMO)

## 2016-02-10 ENCOUNTER — Other Ambulatory Visit: Payer: Managed Care, Other (non HMO)

## 2016-02-10 DIAGNOSIS — E785 Hyperlipidemia, unspecified: Secondary | ICD-10-CM

## 2016-02-10 LAB — LIPID PANEL
Cholesterol: 178 mg/dL (ref 125–200)
HDL: 62 mg/dL (ref >=40)
LDL Cholesterol: 84 mg/dL (ref <130)
TRIGLYCERIDES: 158 mg/dL — AB (ref <150)
Total CHOL/HDL Ratio: 2.9 Ratio (ref <=5.0)
VLDL: 32 mg/dL — AB (ref <30)

## 2016-02-10 LAB — ALT: ALT: 23 U/L (ref 9–46)

## 2016-02-14 ENCOUNTER — Telehealth: Payer: Self-pay | Admitting: Medical

## 2016-02-14 NOTE — Telephone Encounter (Signed)
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 Pt can be reached at [phone] (M)

## 2016-02-15 ENCOUNTER — Other Ambulatory Visit: Payer: Self-pay | Admitting: Medical

## 2016-02-15 MED ORDER — PRAVASTATIN SODIUM 20 MG PO TABS: 20.0000 mg | ORAL_TABLET | Freq: Every day | ORAL | Status: DC

## 2016-02-15 NOTE — Telephone Encounter (Signed)
Forwarding to courtney

## 2016-02-15 NOTE — Telephone Encounter (Signed)
numbers look improved.   C/t same medication.  F/u 67mo for recheck.  This will be 23mo f/u on the new medication.

## 2016-02-15 NOTE — Telephone Encounter (Signed)
Pt is aware and will call back for appt. 

## 2016-04-23 ENCOUNTER — Other Ambulatory Visit: Payer: Self-pay | Admitting: Medical

## 2016-04-23 ENCOUNTER — Telehealth: Payer: Self-pay | Admitting: Medical

## 2016-04-23 ENCOUNTER — Other Ambulatory Visit: Payer: Managed Care, Other (non HMO)

## 2016-04-23 DIAGNOSIS — E785 Hyperlipidemia, unspecified: Secondary | ICD-10-CM

## 2016-04-23 NOTE — Telephone Encounter (Signed)
Pt requesting to come in for lab only appt today since he has the day off then he will schedule med check appt tocome in to see Jared HewsShane a few days after  Is this ok?

## 2016-04-23 NOTE — Telephone Encounter (Signed)
Advised pt & he made lab appt. Pt will make appt to see Jared Coffey when he comes in for lab appt today

## 2016-04-23 NOTE — Telephone Encounter (Signed)
That is fine, I put order in

## 2016-04-24 ENCOUNTER — Other Ambulatory Visit (INDEPENDENT_AMBULATORY_CARE_PROVIDER_SITE_OTHER): Payer: Managed Care, Other (non HMO)

## 2016-04-24 DIAGNOSIS — E785 Hyperlipidemia, unspecified: Secondary | ICD-10-CM

## 2016-04-24 LAB — COMPREHENSIVE METABOLIC PANEL
ALBUMIN: 4.4 g/dL (ref 3.6–5.1)
ALK PHOS: 57 U/L (ref 40–115)
ALT: 18 U/L (ref 9–46)
AST: 19 U/L (ref 10–35)
BILIRUBIN TOTAL: 0.7 mg/dL (ref 0.2–1.2)
BUN: 15 mg/dL (ref 7–25)
CO2: 24 mmol/L (ref 20–31)
CREATININE: 1.04 mg/dL (ref 0.70–1.25)
Calcium: 9.4 mg/dL (ref 8.6–10.3)
Chloride: 106 mmol/L (ref 98–110)
Glucose, Bld: 96 mg/dL (ref 65–99)
Potassium: 4.5 mmol/L (ref 3.5–5.3)
SODIUM: 140 mmol/L (ref 135–146)
TOTAL PROTEIN: 6.5 g/dL (ref 6.1–8.1)

## 2016-04-24 LAB — LIPID PANEL
CHOLESTEROL: 167 mg/dL (ref 125–200)
HDL: 61 mg/dL (ref >=40)
LDL Cholesterol: 94 mg/dL (ref <130)
TRIGLYCERIDES: 58 mg/dL (ref <150)
Total CHOL/HDL Ratio: 2.7 Ratio (ref <=5.0)
VLDL: 12 mg/dL (ref <30)

## 2016-04-25 ENCOUNTER — Other Ambulatory Visit: Payer: Self-pay | Admitting: Family Medicine

## 2016-04-25 MED ORDER — PRAVASTATIN SODIUM 20 MG PO TABS: 20.0000 mg | ORAL_TABLET | Freq: Every day | ORAL | 0 refills | Status: DC

## 2016-07-21 ENCOUNTER — Other Ambulatory Visit: Payer: Self-pay | Admitting: Medical

## 2016-08-02 ENCOUNTER — Ambulatory Visit (INDEPENDENT_AMBULATORY_CARE_PROVIDER_SITE_OTHER): Payer: Managed Care, Other (non HMO) | Admitting: Physician Assistant

## 2016-08-02 ENCOUNTER — Encounter: Payer: Self-pay | Admitting: Physician Assistant

## 2016-08-02 VITALS — BP 112/68 | HR 67 | Temp 98.4°F | Resp 14 | Ht 68.5 in | Wt 180.0 lb

## 2016-08-02 DIAGNOSIS — M545 Low back pain, unspecified: Secondary | ICD-10-CM

## 2016-08-02 DIAGNOSIS — E785 Hyperlipidemia, unspecified: Secondary | ICD-10-CM | POA: Diagnosis not present

## 2016-08-02 DIAGNOSIS — K219 Gastro-esophageal reflux disease without esophagitis: Secondary | ICD-10-CM | POA: Diagnosis not present

## 2016-08-02 DIAGNOSIS — L0100 Impetigo, unspecified: Secondary | ICD-10-CM | POA: Diagnosis not present

## 2016-08-02 MED ORDER — MELOXICAM 15 MG PO TABS: 15.0000 mg | ORAL_TABLET | Freq: Every day | ORAL | 0 refills | Status: DC

## 2016-08-02 MED ORDER — MUPIROCIN CALCIUM 2 % EX CREA: 1.0000 "application " | TOPICAL_CREAM | Freq: Two times a day (BID) | CUTANEOUS | 0 refills | Status: DC

## 2016-08-02 NOTE — Progress Notes (Signed)
Patient presents to clinic today to establish care.  Acute Concerns: Patient c/o 1 week of rash of lower chin that is somewhat pruritic. Non-painful.  Denies fever, chills. Denies trauma or injury to the area. Denies recent travel or contact with similar symptoms.   Chronic Issues: Hyperlipidemia -- Patient currently on Pravastatin 20 mg daily. Is taking as directed. Is also on 81 mg ASA daily. Denies side effect of medication.  GERD -- once weekly symptoms of heart burn. Sometimes much less frequent. Depends on trigger foods which he tries to avoid.   Health Maintenance: Immunizations -- Declines flu shot. Tetanus up-to-date. Would like zostavax. Denies history of chicken pox. Would like antibodies checked when he returns for physical..  Colonoscopy -- Has never had. Was scheduled for this before but was canceled on him the day of procedure. Patient heard of Cologuard. Would like to discuss.    Past Medical History:  Diagnosis Date  . GERD (gastroesophageal reflux disease)   . H/O necrotizing fasciitis 2009   right upper thigh  . Hyperlipidemia    lower with diet changes  . Wears glasses   . Wears partial dentures     Past Surgical History:  Procedure Laterality Date  . COLONOSCOPY     declines, will refer for Cologuard 09/2015  . LEG SURGERY  2009   right upper leg,necrotizing fascitis  . NASAL SINUS SURGERY     age 61yo    Current Outpatient Prescriptions on File Prior to Visit  Medication Sig Dispense Refill  . omeprazole (PRILOSEC) 10 MG capsule Take 10 mg by mouth daily. Reported on 10/21/2015    . pravastatin (PRAVACHOL) 20 MG tablet Take 1 tablet (20 mg total) by mouth daily. 90 tablet 0  . aspirin EC 81 MG tablet Take 81 mg by mouth daily. Reported on 10/21/2015     No current facility-administered medications on file prior to visit.     Allergies  Allergen Reactions  . Penicillins Rash    Family History  Problem Relation Age of Onset  . Stroke Mother     . Dementia Mother     vascular  . Cancer Father 27    died of bladder cancer  . Osteoporosis Sister   . Diabetes Neg Hx   . Heart disease Neg Hx   . Hypertension Neg Hx   . Colon cancer Neg Hx     Social History   Social History  . Marital status: Married    Spouse name: N/A  . Number of children: N/A  . Years of education: N/A   Occupational History  . Not on file.   Social History Main Topics  . Smoking status: Former Smoker    Packs/day: 0.50    Years: 15.00    Start date: 06/20/2004  . Smokeless tobacco: Never Used  . Alcohol use No  . Drug use: No  . Sexual activity: Not on file   Other Topics Concern  . Not on file   Social History Narrative   Married, 2 children from ex wife, exercise - 5 days per week, 40-50 minutes, kung fu, weight training.  Walk nightly.   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  HENT: Negative for ear discharge, ear pain, hearing loss and tinnitus.   Eyes: Negative for blurred vision, double vision, photophobia and pain.  Respiratory: Negative for cough and  shortness of breath.   Cardiovascular: Negative for chest pain and palpitations.  Gastrointestinal: Negative for abdominal pain, blood in stool, constipation, diarrhea, heartburn, melena, nausea and vomiting.  Genitourinary: Negative for dysuria, flank pain, frequency, hematuria and urgency.  Musculoskeletal: Negative for falls.  Skin: Positive for rash.  Neurological: Negative for dizziness, loss of consciousness and headaches.  Endo/Heme/Allergies: Negative for environmental allergies.  Psychiatric/Behavioral: Negative for depression, hallucinations, substance abuse and suicidal ideas. The patient is not nervous/anxious and does not have insomnia.     BP 112/68   Pulse 67   Temp 98.4 F (36.9 C) (Oral)   Resp 14   Ht 5' 8.5" (1.74 m)   Wt 180 lb (81.6 kg)   SpO2  98%   BMI 26.97 kg/m   Physical Exam  Constitutional: He is oriented to person, place, and time and well-developed, well-nourished, and in no distress.  HENT:  Head: Normocephalic and atraumatic.  Right Ear: External ear normal.  Left Ear: External ear normal.  Nose: Nose normal.  Mouth/Throat: Oropharynx is clear and moist. No oropharyngeal exudate.  TM within normal limits.  Eyes: Conjunctivae are normal. Pupils are equal, round, and reactive to light.  Neck: Neck supple. No thyromegaly present.  Cardiovascular: Normal rate, regular rhythm, normal heart sounds and intact distal pulses.   Pulmonary/Chest: Effort normal and breath sounds normal. No respiratory distress. He has no wheezes. He has no rales. He exhibits no tenderness.  Abdominal: Soft. Bowel sounds are normal. He exhibits no distension and no mass. There is no tenderness. There is no rebound and no guarding.  Musculoskeletal: Normal range of motion.  Lymphadenopathy:    He has no cervical adenopathy.  Neurological: He is alert and oriented to person, place, and time.  Skin: Skin is warm and dry.     Psychiatric: Affect normal.  Vitals reviewed.  Assessment/Plan: 1. Impetigo Rx Bactroban topical. Apply as directed. Supportive measures reviewed. FU if not resolving. - mupirocin cream (BACTROBAN) 2 %; Apply 1 application topically 2 (two) times daily. For 1 week  Dispense: 15 g; Refill: 0  2. Acute bilateral low back pain without sciatica Rx mobic once daily with food for 1 week. Supportive measures and OTC medications reviewed. - meloxicam (MOBIC) 15 MG tablet; Take 1 tablet (15 mg total) by mouth daily.  Dispense: 15 tablet; Refill: 0  3. Gastroesophageal reflux disease without esophagitis Continue current medication regimen. If there is worsening symptoms with Mobic he is to stop medication and give us a call.  4. Hyperlipidemia, unspecified hyperlipidemia type Continue current regimen. CPE scheduled. Will check  repeat labs at that time.   Cologuard reviewed. Patient to check with insurance and give a call if he wishes to proceed.    Piedad ClimesMartin, Delpha Perko Cody, PA-C

## 2016-08-02 NOTE — Progress Notes (Signed)
Pre visit review using our clinic review tool, if applicable. No additional management support is needed unless otherwise documented below in the visit note. 

## 2016-08-02 NOTE — Patient Instructions (Signed)
Please continue chronic medications as directed.   Please use the Mupirocin as directed on the area under the lips.  Keep skin clean and dry. No shaving until this is cleared up.  For back pain, limit heavy lifting. Take Mobic once daily with food.  Apply [facility] [place] to the lower back. A heating pad to the area 2 times per day for 10-15 minutes each time.  Follow-up if not resolving.   Follow-up in March for a complete physical.

## 2016-08-12 ENCOUNTER — Telehealth: Payer: Self-pay | Admitting: Medical

## 2016-08-12 DIAGNOSIS — M545 Low back pain, unspecified: Secondary | ICD-10-CM | POA: Insufficient documentation

## 2016-08-12 DIAGNOSIS — L0100 Impetigo, unspecified: Secondary | ICD-10-CM | POA: Insufficient documentation

## 2016-08-12 NOTE — Telephone Encounter (Signed)
Please call and see how he is doing.  I received copy of chart record that he established elsewhere.    I appreciated having him as a patient, and I hope he didn't leave unhappy with our care.

## 2016-08-30 ENCOUNTER — Telehealth: Payer: Self-pay | Admitting: Physician Assistant

## 2016-08-30 ENCOUNTER — Other Ambulatory Visit: Payer: Self-pay | Admitting: Emergency Medicine

## 2016-08-30 MED ORDER — PRAVASTATIN SODIUM 20 MG PO TABS: 20.0000 mg | ORAL_TABLET | Freq: Every day | ORAL | 0 refills | Status: DC

## 2016-08-30 NOTE — Telephone Encounter (Signed)
Pt asking for a refill on pravastatin, [facility] [place]

## 2016-08-30 NOTE — Telephone Encounter (Signed)
Refilled rx for Pravastatin. Notified patient rx sent to the pharmacy

## 2016-09-04 ENCOUNTER — Encounter: Payer: Self-pay | Admitting: Emergency Medicine

## 2016-09-04 ENCOUNTER — Encounter: Payer: Self-pay | Admitting: Physician Assistant

## 2016-09-04 ENCOUNTER — Ambulatory Visit (INDEPENDENT_AMBULATORY_CARE_PROVIDER_SITE_OTHER): Payer: Managed Care, Other (non HMO) | Admitting: Physician Assistant

## 2016-09-04 VITALS — BP 122/72 | HR 61 | Temp 98.1°F | Resp 14 | Ht 69.0 in | Wt 184.0 lb

## 2016-09-04 DIAGNOSIS — Z0184 Encounter for antibody response examination: Secondary | ICD-10-CM

## 2016-09-04 DIAGNOSIS — Z125 Encounter for screening for malignant neoplasm of prostate: Secondary | ICD-10-CM

## 2016-09-04 DIAGNOSIS — Z0000 Encounter for general adult medical examination without abnormal findings: Secondary | ICD-10-CM

## 2016-09-04 DIAGNOSIS — N489 Disorder of penis, unspecified: Secondary | ICD-10-CM | POA: Diagnosis not present

## 2016-09-04 DIAGNOSIS — Z1159 Encounter for screening for other viral diseases: Secondary | ICD-10-CM

## 2016-09-04 DIAGNOSIS — Z114 Encounter for screening for human immunodeficiency virus [HIV]: Secondary | ICD-10-CM | POA: Diagnosis not present

## 2016-09-04 DIAGNOSIS — Z9189 Other specified personal risk factors, not elsewhere classified: Secondary | ICD-10-CM

## 2016-09-04 DIAGNOSIS — E785 Hyperlipidemia, unspecified: Secondary | ICD-10-CM

## 2016-09-04 DIAGNOSIS — Z1211 Encounter for screening for malignant neoplasm of colon: Secondary | ICD-10-CM

## 2016-09-04 LAB — URINALYSIS, ROUTINE W REFLEX MICROSCOPIC
Bilirubin Urine: NEGATIVE
Hgb urine dipstick: NEGATIVE
KETONES UR: NEGATIVE
Leukocytes, UA: NEGATIVE
NITRITE: NEGATIVE
RBC / HPF: NONE SEEN (ref 0–?)
TOTAL PROTEIN, URINE-UPE24: NEGATIVE
URINE GLUCOSE: NEGATIVE
Urobilinogen, UA: 0.2 (ref 0.0–1.0)
WBC, UA: NONE SEEN (ref 0–?)
pH: 6 (ref 5.0–8.0)

## 2016-09-04 LAB — CBC
HCT: 43 % (ref 39.0–52.0)
Hemoglobin: 14.7 g/dL (ref 13.0–17.0)
MCHC: 34.2 g/dL (ref 30.0–36.0)
MCV: 88.8 fl (ref 78.0–100.0)
Platelets: 329 10*3/uL (ref 150.0–400.0)
RBC: 4.85 Mil/uL (ref 4.22–5.81)
RDW: 13.1 % (ref 11.5–15.5)
WBC: 7.1 10*3/uL (ref 4.0–10.5)

## 2016-09-04 LAB — HEMOGLOBIN A1C: Hgb A1c MFr Bld: 5.5 % (ref 4.6–6.5)

## 2016-09-04 LAB — LIPID PANEL
Cholesterol: 193 mg/dL (ref 0–200)
HDL: 60.2 mg/dL (ref >39.00)
NonHDL: 132.8
TRIGLYCERIDES: 233 mg/dL — AB (ref 0.0–149.0)
Total CHOL/HDL Ratio: 3
VLDL: 46.6 mg/dL — ABNORMAL HIGH (ref 0.0–40.0)

## 2016-09-04 LAB — LDL CHOLESTEROL, DIRECT: Direct LDL: 103 mg/dL

## 2016-09-04 LAB — PSA: PSA: 0.55 ng/mL (ref 0.10–4.00)

## 2016-09-04 LAB — TSH: TSH: 1.8 u[IU]/mL (ref 0.35–4.50)

## 2016-09-04 NOTE — Assessment & Plan Note (Signed)
Hep C screen ordered today at patient request.  Has checked insurance coverage.

## 2016-09-04 NOTE — Assessment & Plan Note (Signed)
The natural history of prostate cancer and ongoing controversy regarding screening and potential treatment outcomes of prostate cancer has been discussed with the patient. The meaning of a false positive PSA and a false negative PSA has been discussed. He indicates understanding of the limitations of this screening test and wishes  to proceed with screening PSA testing.  DRE performed today without abnormal findings

## 2016-09-04 NOTE — Assessment & Plan Note (Signed)
Cologuard ordered after discussion of screening.  Patient has checked with insurance and wants this testing performed.

## 2016-09-04 NOTE — Assessment & Plan Note (Signed)
HIV antibody for routine screening

## 2016-09-04 NOTE — Assessment & Plan Note (Addendum)
Depression screen negative. Health Maintenance reviewed -- Tetanus and Flu up-to-date. Checking Varicella Ab today to assess for shingles vaccine.  Preventive schedule discussed and handout given in AVS. Will obtain fasting labs today.

## 2016-09-04 NOTE — Assessment & Plan Note (Signed)
Currently on Pravachol. Taking as directed. Repeat labs today.

## 2016-09-04 NOTE — Patient Instructions (Signed)
Please go to the lab for blood work.   Our office will call you with your results unless you have chosen to receive results via MyChart.  If your blood work is normal we will follow-up each year for physicals and as scheduled for chronic medical problems.  If anything is abnormal we will treat accordingly and get you in for a follow-up.  Please continue chronic medications. You will be contacted for assessment by Dermatology.  Please increase fluid intake. Make sure to get at least 150 minutes of aerobic exercise per week.   Preventive Care 40-64 Years, Male Preventive care refers to lifestyle choices and visits with your health care provider that can promote health and wellness. What does preventive care include?  A yearly physical exam. This is also called an annual well check.  Dental exams once or twice a year.  Routine eye exams. Ask your health care provider how often you should have your eyes checked.  Personal lifestyle choices, including:  Daily care of your teeth and gums.  Regular physical activity.  Eating a healthy diet.  Avoiding tobacco and drug use.  Limiting alcohol use.  Practicing safe sex.  Taking low-dose aspirin every day starting at age 50. What happens during an annual well check? The services and screenings done by your health care provider during your annual well check will depend on your age, overall health, lifestyle risk factors, and family history of disease. Counseling  Your health care provider may ask you questions about your:  Alcohol use.  Tobacco use.  Drug use.  Emotional well-being.  Home and relationship well-being.  Sexual activity.  Eating habits.  Work and work environment. Screening  You may have the following tests or measurements:  Height, weight, and BMI.  Blood pressure.  Lipid and cholesterol levels. These may be checked every 5 years, or more frequently if you are over 50 years old.  Skin  check.  Lung cancer screening. You may have this screening every year starting at age 55 if you have a 30-pack-year history of smoking and currently smoke or have quit within the past 15 years.  Fecal occult blood test (FOBT) of the stool. You may have this test every year starting at age 50.  Flexible sigmoidoscopy or colonoscopy. You may have a sigmoidoscopy every 5 years or a colonoscopy every 10 years starting at age 50.  Prostate cancer screening. Recommendations will vary depending on your family history and other risks.  Hepatitis C blood test.  Hepatitis B blood test.  Sexually transmitted disease (STD) testing.  Diabetes screening. This is done by checking your blood sugar (glucose) after you have not eaten for a while (fasting). You may have this done every 1-3 years. Discuss your test results, treatment options, and if necessary, the need for more tests with your health care provider. Vaccines  Your health care provider may recommend certain vaccines, such as:  Influenza vaccine. This is recommended every year.  Tetanus, diphtheria, and acellular pertussis (Tdap, Td) vaccine. You may need a Td booster every 10 years.  Varicella vaccine. You may need this if you have not been vaccinated.  Zoster vaccine. You may need this after age 60.  Measles, mumps, and rubella (MMR) vaccine. You may need at least one dose of MMR if you were born in 1957 or later. You may also need a second dose.  Pneumococcal 13-valent conjugate (PCV13) vaccine. You may need this if you have certain conditions and have not been vaccinated.    Pneumococcal polysaccharide (PPSV23) vaccine. You may need one or two doses if you smoke cigarettes or if you have certain conditions.  Meningococcal vaccine. You may need this if you have certain conditions.  Hepatitis A vaccine. You may need this if you have certain conditions or if you travel or work in places where you may be exposed to hepatitis  A.  Hepatitis B vaccine. You may need this if you have certain conditions or if you travel or work in places where you may be exposed to hepatitis B.  Haemophilus influenzae type b (Hib) vaccine. You may need this if you have certain risk factors. Talk to your health care provider about which screenings and vaccines you need and how often you need them. This information is not intended to replace advice given to you by your health care provider. Make sure you discuss any questions you have with your health care provider. Document Released: 08/12/2015 Document Revised: 04/04/2016 Document Reviewed: 05/17/2015 Elsevier Interactive Patient Education  2017 Elsevier Inc.       

## 2016-09-04 NOTE — Progress Notes (Signed)
Patient presents to clinic today for annual exam.  Patient is fasting for labs.  Acute Concerns: Patient c/o lesion of the penis that has been present for many years. Denies pain, pruritus. Endorses has had this assessed by previous PCP before and was told to keep watch on it. States he feels this has enlarged since last check. Still asymptomatic. Denies known history of HPV.   Chronic Issues: Hyperlipidemia -- Currently on Pravachol. Previously well-controlled on this regimen. Is taking as directed without side effect. Body mass index is 27.17 kg/m. Is staying active. Endorses well-balanced diet.   Health Maintenance: Immunizations -- Unsure of chicken pox for shingles shot. Flu and Tetanus up-to-date. Colonoscopy -- Would like to proceed with Cologuard. Denies prior history of colon cancer screening. Denies family history of colorectal cancer.  Hep C -- Checked with insurance. Cover screening. Wishes to have today.   Past Medical History:  Diagnosis Date  . GERD (gastroesophageal reflux disease)   . H/O necrotizing fasciitis 2009   right upper thigh  . Hyperlipidemia    lower with diet changes  . Wears glasses   . Wears partial dentures     Past Surgical History:  Procedure Laterality Date  . COLONOSCOPY     declines, will refer for Cologuard 09/2015  . LEG SURGERY  2009   right upper leg,necrotizing fascitis  . NASAL SINUS SURGERY     age 61yo    Current Outpatient Prescriptions on File Prior to Visit  Medication Sig Dispense Refill  . omeprazole (PRILOSEC) 10 MG capsule Take 10 mg by mouth daily. Reported on 10/21/2015    . pravastatin (PRAVACHOL) 20 MG tablet Take 1 tablet (20 mg total) by mouth daily. 90 tablet 0   No current facility-administered medications on file prior to visit.     Allergies  Allergen Reactions  . Penicillins Rash    Family History  Problem Relation Age of Onset  . Stroke Mother   . Dementia Mother     vascular  . Cancer Father 562      died of bladder cancer  . Osteoporosis Sister   . Diabetes Neg Hx   . Heart disease Neg Hx   . Hypertension Neg Hx   . Colon cancer Neg Hx     Social History   Social History  . Marital status: Married    Spouse name: N/A  . Number of children: N/A  . Years of education: N/A   Occupational History  . Not on file.   Social History Main Topics  . Smoking status: Former Smoker    Packs/day: 0.50    Years: 15.00    Start date: 06/20/2004  . Smokeless tobacco: Never Used  . Alcohol use No  . Drug use: No  . Sexual activity: Not Currently   Other Topics Concern  . Not on file   Social History Narrative   Married, 2 children from ex wife, exercise - 5 days per week, 40-50 minutes, kung fu, weight training.  Walk nightly.   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  HENT: Negative for ear discharge, ear pain, hearing loss and tinnitus.   Eyes: Negative for blurred vision, double vision, photophobia and pain.  Respiratory: Negative for cough and shortness of breath.   Cardiovascular: Negative for chest pain and palpitations.  Gastrointestinal: Negative for abdominal pain, blood in  stool, constipation, diarrhea, heartburn, melena, nausea and vomiting.  Genitourinary: Negative for dysuria, flank pain, frequency, hematuria and urgency.  Musculoskeletal: Negative for falls.  Neurological: Negative for dizziness, loss of consciousness and headaches.  Endo/Heme/Allergies: Negative for environmental allergies.  Psychiatric/Behavioral: Negative for depression, hallucinations, substance abuse and suicidal ideas. The patient is not nervous/anxious and does not have insomnia.     BP 122/72   Pulse 61   Temp 98.1 F (36.7 C) (Oral)   Resp 14   Ht 5\' 9"  (1.753 m)   Wt 184 lb (83.5 kg)   SpO2 98%   BMI 27.17 kg/m   Physical Exam  Constitutional: He is oriented to  person, place, and time and well-developed, well-nourished, and in no distress.  HENT:  Head: Normocephalic and atraumatic.  Right Ear: External ear normal.  Left Ear: External ear normal.  Nose: Nose normal.  Mouth/Throat: Oropharynx is clear and moist. No oropharyngeal exudate.  Eyes: Conjunctivae and EOM are normal. Pupils are equal, round, and reactive to light.  Neck: Neck supple. No thyromegaly present.  Cardiovascular: Normal rate, regular rhythm, normal heart sounds and intact distal pulses.   Pulmonary/Chest: Effort normal and breath sounds normal. No respiratory distress. He has no wheezes. He has no rales. He exhibits no tenderness.  Abdominal: Soft. Bowel sounds are normal. He exhibits no distension and no mass. There is no tenderness. There is no rebound and no guarding.  Genitourinary: Prostate normal and testes/scrotum normal. Prostate is not enlarged and not tender. No discharge found.  Genitourinary Comments: 4 mm x 3 mm hyperpigmented macular lesion of left dorsal penis with some raised border noted. Has enlarged from previous physical per notes from last PCP  Lymphadenopathy:    He has no cervical adenopathy.  Neurological: He is alert and oriented to person, place, and time.  Skin: Skin is warm and dry. No rash noted.  Psychiatric: Affect normal.  Vitals reviewed.   No results found for this or any previous visit (from the past 2160 hour(s)).  Assessment/Plan: Visit for preventive health examination Depression screen negative. Health Maintenance reviewed -- Tetanus and Flu up-to-date. Checking Varicella Ab today to assess for shingles vaccine.  Preventive schedule discussed and handout given in AVS. Will obtain fasting labs today.  Screening for HIV (human immunodeficiency virus) HIV antibody for routine screening  Prostate cancer screening The natural history of prostate cancer and ongoing controversy regarding screening and potential treatment outcomes of  prostate cancer has been discussed with the patient. The meaning of a false positive PSA and a false negative PSA has been discussed. He indicates understanding of the limitations of this screening test and wishes  to proceed with screening PSA testing.  DRE performed today without abnormal findings   Hyperlipidemia Currently on Pravachol. Taking as directed. Repeat labs today.  Encounter for hepatitis C virus screening test for high risk patient Hep C screen ordered today at patient request.  Has checked insurance coverage.  Colon cancer screening Cologuard ordered after discussion of screening.  Patient has checked with insurance and wants this testing performed.     Piedad Climes, PA-C

## 2016-09-04 NOTE — Progress Notes (Signed)
Pre visit review using our clinic review tool, if applicable. No additional management support is needed unless otherwise documented below in the visit note. 

## 2016-09-05 LAB — VARICELLA ZOSTER ANTIBODY, IGG: VARICELLA IGG: 1140 {index} — AB (ref <135.00)

## 2016-09-05 LAB — HEPATITIS C ANTIBODY: HCV Ab: NEGATIVE

## 2016-09-05 LAB — HIV ANTIBODY (ROUTINE TESTING W REFLEX): HIV 1&2 Ab, 4th Generation: NONREACTIVE

## 2016-09-12 ENCOUNTER — Other Ambulatory Visit: Payer: Self-pay | Admitting: Physician Assistant

## 2016-09-12 ENCOUNTER — Other Ambulatory Visit (INDEPENDENT_AMBULATORY_CARE_PROVIDER_SITE_OTHER): Payer: Managed Care, Other (non HMO)

## 2016-09-12 DIAGNOSIS — Z299 Encounter for prophylactic measures, unspecified: Secondary | ICD-10-CM

## 2016-09-12 LAB — COMPREHENSIVE METABOLIC PANEL
ALK PHOS: 58 U/L (ref 39–117)
ALT: 24 U/L (ref 0–53)
AST: 21 U/L (ref 0–37)
Albumin: 4.5 g/dL (ref 3.5–5.2)
BILIRUBIN TOTAL: 0.7 mg/dL (ref 0.2–1.2)
BUN: 16 mg/dL (ref 6–23)
CO2: 28 meq/L (ref 19–32)
Calcium: 9.3 mg/dL (ref 8.4–10.5)
Chloride: 105 mEq/L (ref 96–112)
Creatinine, Ser: 0.99 mg/dL (ref 0.40–1.50)
GFR: 81.44 mL/min (ref >60.00)
Glucose, Bld: 56 mg/dL — ABNORMAL LOW (ref 70–99)
Potassium: 4.1 mEq/L (ref 3.5–5.1)
SODIUM: 139 meq/L (ref 135–145)
TOTAL PROTEIN: 6.7 g/dL (ref 6.0–8.3)

## 2016-09-13 ENCOUNTER — Ambulatory Visit: Payer: Managed Care, Other (non HMO)

## 2016-09-17 ENCOUNTER — Telehealth: Payer: Self-pay | Admitting: Physician Assistant

## 2016-09-17 NOTE — Telephone Encounter (Signed)
Patient called and said that insurance wants patietn to have

## 2016-09-17 NOTE — Telephone Encounter (Signed)
Error on last note. Transferred patient directly to [place] location to better assist request.

## 2016-09-18 ENCOUNTER — Other Ambulatory Visit: Payer: Managed Care, Other (non HMO)

## 2016-09-18 LAB — COLOGUARD: COLOGUARD: NEGATIVE

## 2016-09-19 ENCOUNTER — Other Ambulatory Visit (INDEPENDENT_AMBULATORY_CARE_PROVIDER_SITE_OTHER): Payer: Managed Care, Other (non HMO)

## 2016-09-19 DIAGNOSIS — R7309 Other abnormal glucose: Secondary | ICD-10-CM

## 2016-09-19 DIAGNOSIS — E162 Hypoglycemia, unspecified: Secondary | ICD-10-CM

## 2016-09-19 LAB — POCT CBG (FASTING - GLUCOSE)-MANUAL ENTRY: Glucose Fasting, POC: 93 mg/dL (ref 70–99)

## 2016-09-28 ENCOUNTER — Encounter: Payer: Self-pay | Admitting: Emergency Medicine

## 2016-10-11 ENCOUNTER — Telehealth: Payer: Self-pay | Admitting: Physician Assistant

## 2016-10-11 NOTE — Telephone Encounter (Signed)
Patient requesting call to be notified when office receives shipment of Shingrix.  Patient states he does not want to get vaccine at pharmacy, he prefers to get it in the office.

## 2016-10-15 ENCOUNTER — Emergency Department (HOSPITAL_BASED_OUTPATIENT_CLINIC_OR_DEPARTMENT_OTHER): Payer: Worker's Compensation

## 2016-10-15 ENCOUNTER — Encounter (HOSPITAL_BASED_OUTPATIENT_CLINIC_OR_DEPARTMENT_OTHER): Payer: Self-pay | Admitting: *Deleted

## 2016-10-15 ENCOUNTER — Emergency Department (HOSPITAL_BASED_OUTPATIENT_CLINIC_OR_DEPARTMENT_OTHER)
Admission: EM | Admit: 2016-10-15 | Discharge: 2016-10-15 | Disposition: A | Discharge status: Home / Self Care | Payer: Worker's Compensation | Attending: Emergency Medicine | Admitting: Emergency Medicine

## 2016-10-15 DIAGNOSIS — Y929 Unspecified place or not applicable: Secondary | ICD-10-CM | POA: Diagnosis not present

## 2016-10-15 DIAGNOSIS — Y9389 Activity, other specified: Secondary | ICD-10-CM | POA: Diagnosis not present

## 2016-10-15 DIAGNOSIS — Y999 Unspecified external cause status: Secondary | ICD-10-CM | POA: Insufficient documentation

## 2016-10-15 DIAGNOSIS — W11XXXA Fall on and from ladder, initial encounter: Secondary | ICD-10-CM | POA: Insufficient documentation

## 2016-10-15 DIAGNOSIS — S59912A Unspecified injury of left forearm, initial encounter: Secondary | ICD-10-CM | POA: Diagnosis present

## 2016-10-15 DIAGNOSIS — Z87891 Personal history of nicotine dependence: Secondary | ICD-10-CM | POA: Insufficient documentation

## 2016-10-15 DIAGNOSIS — S5002XA Contusion of left elbow, initial encounter: Secondary | ICD-10-CM | POA: Diagnosis not present

## 2016-10-15 MED ORDER — IBUPROFEN 800 MG PO TABS: 800.0000 mg | ORAL_TABLET | Freq: Three times a day (TID) | ORAL | 0 refills | Status: DC

## 2016-10-15 MED ORDER — IBUPROFEN 800 MG PO TABS
800.0000 mg | ORAL_TABLET | Freq: Once | ORAL | Status: AC
  Administered 2016-10-15: 800 mg via ORAL
  Filled 2016-10-15: qty 1

## 2016-10-15 NOTE — ED Provider Notes (Signed)
[facility] Provider Note   CSN: [id number] Arrival date & time: 10/15/16  2028     History   Chief Complaint Chief Complaint  Patient presents with  . Fall    HPI Jared Coffey is a 61 y.o. male.  The history is provided by the patient. No language interpreter was used.  Fall    Jared Coffey is a 61 y.o. male who presents to the Emergency Department complaining of fall.  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 He experienced immediate pain in his left elbow. He is right handed. He denies any additional injuries. He has pain with extension of the elbow. No prior similar injuries. Sxs are moderate and constant in nature.   Past Medical History:  Diagnosis Date  . GERD (gastroesophageal reflux disease)   . H/O necrotizing fasciitis 2009   right upper thigh  . Hyperlipidemia    lower with diet changes  . Wears glasses   . Wears partial dentures     Patient Active Problem List   Diagnosis Date Noted  . Visit for preventive health examination 09/04/2016  . Prostate cancer screening 09/04/2016  . Penile lesion 09/04/2016  . Screening for HIV (human immunodeficiency virus) 09/04/2016  . Encounter for hepatitis C virus screening test for high risk patient 09/04/2016  . Colon cancer screening 09/04/2016  . Hyperlipidemia 10/21/2015  . Gastroesophageal reflux disease without esophagitis 10/21/2015    Past Surgical History:  Procedure Laterality Date  . COLONOSCOPY     declines, will refer for Cologuard 09/2015  . LEG SURGERY  2009   right upper leg,necrotizing fascitis  . NASAL SINUS SURGERY     age 35yo       Home Medications    Prior to Admission medications   Medication Sig Start Date End Date Taking? Authorizing Provider  omeprazole (PRILOSEC) 10 MG capsule Take 10 mg by mouth daily. Reported on 10/21/2015    Historical Provider, MD  pravastatin  (PRAVACHOL) 20 MG tablet Take 1 tablet (20 mg total) by mouth daily. 08/30/16   Waldon Merl, PA-C    Family History Family History  Problem Relation Age of Onset  . Stroke Mother   . Dementia Mother     vascular  . Cancer Father 63    died of bladder cancer  . Osteoporosis Sister   . Diabetes Neg Hx   . Heart disease Neg Hx   . Hypertension Neg Hx   . Colon cancer Neg Hx     Social History Social History  Substance Use Topics  . Smoking status: Former Smoker    Packs/day: 0.50    Years: 15.00    Start date: 06/20/2004  . Smokeless tobacco: Never Used  . Alcohol use No     Allergies   Penicillins   Review of Systems Review of Systems  All other systems reviewed and are negative.    Physical Exam Updated Vital Signs BP (!) 145/76 (BP Location: Right Arm)   Pulse 62   Temp 98.3 F (36.8 C) (Oral)   Resp 20   Ht 5\' 7"  (1.702 m)   Wt 178 lb (80.7 kg)   SpO2 99%   BMI 27.88 kg/m   Physical Exam  Constitutional: He is oriented to person, place, and time. He appears well-developed and well-nourished. No distress.  HENT:  Head: Normocephalic and atraumatic.  Cardiovascular: Normal rate.  Pulmonary/Chest: Effort normal. No respiratory distress.  Musculoskeletal:  Swelling and tenderness to the left posterior elbow. Flexion extension intact at the elbow. No tenderness over the left shoulder. 2+ radial pulses bilaterally. No pain with supination or pronation of the arm. Small, superficial abrasion over the left posterior elbow  Neurological: He is alert and oriented to person, place, and time.  5 out of 5 strength in bilateral upper extremities, 5 out of 5 grip strength bilaterally.  Skin: Skin is warm and dry. Capillary refill takes less than 2 seconds.  Psychiatric: He has a normal mood and affect. His behavior is normal.  Nursing note and vitals reviewed.    ED Treatments / Results  Labs (all labs ordered are listed, but only abnormal results are  displayed) Labs Reviewed - No data to display  EKG  EKG Interpretation None       Radiology Dg Elbow Complete Left  Result Date: 10/15/2016 CLINICAL DATA:  Fall from ladder with left elbow pain, initial encounter EXAM: LEFT ELBOW - COMPLETE 3+ VIEW COMPARISON:  None. FINDINGS: No acute fracture or dislocation is noted. Mild soft tissue swelling is noted over the olecranon consistent with the recent injury and history. IMPRESSION: Mild soft tissue swelling without acute bony abnormality. Electronically Signed   By: Alcide CleverMark  Lukens M.D.   On: 10/15/2016 21:21    Procedures Procedures (including critical care time)  Medications Ordered in ED Medications - No data to display   Initial Impression / Assessment and Plan / ED Course  I have reviewed the triage vital signs and the nursing notes.  Pertinent labs & imaging results that were available during my care of the patient were reviewed by me and considered in my medical decision making (see chart for details).     Patient here for evaluation of left shoulder elbow pain following a fall. He has mild swelling and tenderness over the joint with normal range of motion. Discussed the patient home care for elbow contusion. Recommend ibuprofen, rest, outpatient follow-up. No evidence of acute fracture, dislocation.  Final Clinical Impressions(s) / ED Diagnoses   Final diagnoses:  Contusion of left elbow, initial encounter    New Prescriptions New Prescriptions   No medications on file     Tilden FossaElizabeth Iveth Heidemann, MD 10/15/16 2350

## 2016-10-15 NOTE — ED Triage Notes (Signed)
Larey SeatFell off of a ladder from about 5', fell at 11:10, slipped on grease, here for L elbow pain, denies other pains or injuries, "gradually progressively worse thorough out the day", no meds PTA (denies: LOC, visual changes, neck or back pain, dizziness or nv). Pinpoints pain to medial posterior elbow, CMS, ROM, and skin intact, movement limited d/t pain. Alert, NAD, calm, interactive, resps e/u, speaking in clear complete sentences, no dyspnea noted, skin W&D.

## 2016-10-23 ENCOUNTER — Encounter: Payer: Self-pay | Admitting: Physician Assistant

## 2016-11-05 ENCOUNTER — Encounter: Payer: Self-pay | Admitting: Physician Assistant

## 2016-11-08 NOTE — Telephone Encounter (Signed)
Per pharmacy this should be in our office either Friday or Monday. I would say ok to schedule pt for a nurse visit in a week.

## 2016-11-09 ENCOUNTER — Encounter: Payer: Self-pay | Admitting: Emergency Medicine

## 2016-11-09 ENCOUNTER — Ambulatory Visit (INDEPENDENT_AMBULATORY_CARE_PROVIDER_SITE_OTHER): Payer: Managed Care, Other (non HMO) | Admitting: Physician Assistant

## 2016-11-09 ENCOUNTER — Encounter: Payer: Self-pay | Admitting: Physician Assistant

## 2016-11-09 VITALS — BP 120/64 | HR 65 | Temp 98.1°F | Resp 14 | Ht 67.0 in | Wt 179.0 lb

## 2016-11-09 DIAGNOSIS — N481 Balanitis: Secondary | ICD-10-CM

## 2016-11-09 DIAGNOSIS — B370 Candidal stomatitis: Secondary | ICD-10-CM

## 2016-11-09 MED ORDER — NYSTATIN 100000 UNIT/ML MT SUSP: 5.0000 mL | Freq: Four times a day (QID) | OROMUCOSAL | 0 refills | Status: DC

## 2016-11-09 MED ORDER — CLOTRIMAZOLE 1 % EX CREA: 1.0000 "application " | TOPICAL_CREAM | Freq: Two times a day (BID) | CUTANEOUS | 0 refills | Status: DC

## 2016-11-09 MED ORDER — MAGIC MOUTHWASH W/LIDOCAINE: 5.0000 mL | Freq: Three times a day (TID) | ORAL | 0 refills | Status: DC | PRN

## 2016-11-09 NOTE — Patient Instructions (Signed)
Please apply the cream as directed (very small amount) to the irritated area on the penis. Do this twice daily for 1 week. Keep skin otherwise clean and dry. Let me know if symptoms are not improving.  Use the prescription mouthwash as directed over the next 4-5 days. Symptoms should gradually improve. In not, please let us know. Avoid any use of the flute mouthpiece.   If anything worsens, please go to the ER.

## 2016-11-09 NOTE — Progress Notes (Signed)
Pre visit review using our clinic review tool, if applicable. No additional management support is needed unless otherwise documented below in the visit note. 

## 2016-11-09 NOTE — Progress Notes (Signed)
Patient presents to clinic today c/o sore/painful tongue over the past few days after using a new mouthpiece on his flute. Endorses rash around mouth initially that resolved on its own. Notes mild odynophagia without dysphagia. Denies fever, chills or URI symptoms.  Patient also notes mild irritation to this penis with redness and itching. Denies current sexual activity. Has a history of yeast in the area but has not had this problem in several years. States he scratched the area yesterday significantly and irritated the region.   Past Medical History:  Diagnosis Date  . GERD (gastroesophageal reflux disease)   . H/O necrotizing fasciitis 2009   right upper thigh  . Hyperlipidemia    lower with diet changes  . Wears glasses   . Wears partial dentures     Current Outpatient Prescriptions on File Prior to Visit  Medication Sig Dispense Refill  . ibuprofen (ADVIL,MOTRIN) 800 MG tablet Take 1 tablet (800 mg total) by mouth 3 (three) times daily. 15 tablet 0  . omeprazole (PRILOSEC) 10 MG capsule Take 10 mg by mouth daily. Reported on 10/21/2015    . pravastatin (PRAVACHOL) 20 MG tablet Take 1 tablet (20 mg total) by mouth daily. 90 tablet 0   No current facility-administered medications on file prior to visit.     Allergies  Allergen Reactions  . Penicillins Rash    Family History  Problem Relation Age of Onset  . Stroke Mother   . Dementia Mother     vascular  . Cancer Father 54    died of bladder cancer  . Osteoporosis Sister   . Diabetes Neg Hx   . Heart disease Neg Hx   . Hypertension Neg Hx   . Colon cancer Neg Hx     Social History   Social History  . Marital status: Married    Spouse name: N/A  . Number of children: N/A  . Years of education: N/A   Social History Main Topics  . Smoking status: Former Smoker    Packs/day: 0.50    Years: 15.00    Start date: 06/20/2004  . Smokeless tobacco: Never Used  . Alcohol use No  . Drug use: No  . Sexual activity:  Not Currently   Other Topics Concern  . None   Social History Narrative   Married, 2 children from ex wife, exercise - 5 days per week, 40-50 minutes, kung fu, weight training.  Walk nightly.   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  Was working as a Recruitment consultant for [employer] - See HPI.  All other ROS are negative.  BP 120/64   Pulse 65   Temp 98.1 F (36.7 C) (Oral)   Resp 14   Ht _0  (1.702 m)   Wt 179 lb (81.2 kg)   SpO2 98%   BMI 28.04 kg/m   Physical Exam  Constitutional: He is oriented to person, place, and time and well-developed, well-nourished, and in no distress.  HENT:  Head: Normocephalic and atraumatic.  Right Ear: Tympanic membrane normal.  Left Ear: Tympanic membrane normal.  Nose: Nose normal.  Mouth/Throat: Uvula is midline, oropharynx is clear and moist and mucous membranes are normal. No posterior oropharyngeal edema, posterior oropharyngeal erythema or tonsillar abscesses.    Eyes: Conjunctivae are normal.  Neck: Neck supple.  Cardiovascular: Normal rate, regular rhythm, normal heart sounds and intact distal pulses.   Pulmonary/Chest: Effort normal and breath sounds normal. No respiratory distress. He has no  wheezes. He has no rales. He exhibits no tenderness.  Genitourinary:  Genitourinary Comments: circumsized male. No swelling or redness or corona. There is irritation underneath coronal sulcus (dorsal and ventral) without lesion. Mild scaling noted.  Neurological: He is alert and oriented to person, place, and time.  Skin: Skin is warm and dry. No rash noted.  Psychiatric: Affect normal.  Vitals reviewed.   Recent Results (from the past 2160 hour(s))  CBC     Status: None   Collection Time: 09/04/16  2:21 PM  Result Value Ref Range   WBC 7.1 4.0 - 10.5 K/uL   RBC 4.85 4.22 - 5.81 Mil/uL   Platelets 329.0 150.0 - 400.0 K/uL   Hemoglobin 14.7 13.0 - 17.0 g/dL   HCT 43.0 39.0 - 52.0 %   MCV 88.8 78.0 - 100.0 fl   MCHC 34.2 30.0  - 36.0 g/dL   RDW 13.1 11.5 - 15.5 %  Lipid panel     Status: Abnormal   Collection Time: 09/04/16  2:21 PM  Result Value Ref Range   Cholesterol 193 0 - 200 mg/dL    Comment: ATP III Classification       Desirable:  < 200 mg/dL               Borderline High:  200 - 239 mg/dL          High:  > = 240 mg/dL   Triglycerides 233.0 (H) 0.0 - 149.0 mg/dL    Comment: Normal:  <150 mg/dLBorderline High:  150 - 199 mg/dL   HDL 60.20 >39.00 mg/dL   VLDL 46.6 (H) 0.0 - 40.0 mg/dL   Total CHOL/HDL Ratio 3     Comment:                Men          Women1/2 Average Risk     3.4          3.3Average Risk          5.0          4.42X Average Risk          9.6          7.13X Average Risk          15.0          11.0                       NonHDL 132.80     Comment: NOTE:  Non-HDL goal should be 30 mg/dL higher than patient's LDL goal (i.e. LDL goal of < 70 mg/dL, would have non-HDL goal of < 100 mg/dL)  PSA     Status: None   Collection Time: 09/04/16  2:21 PM  Result Value Ref Range   PSA 0.55 0.10 - 4.00 ng/mL  Hemoglobin A1c     Status: None   Collection Time: 09/04/16  2:21 PM  Result Value Ref Range   Hgb A1c MFr Bld 5.5 4.6 - 6.5 %    Comment: Glycemic Control Guidelines for People with Diabetes:Non Diabetic:  <6%Goal of Therapy: <7%Additional Action Suggested:  >8%   TSH     Status: None   Collection Time: 09/04/16  2:21 PM  Result Value Ref Range   TSH 1.80 0.35 - 4.50 uIU/mL  Urinalysis, Routine w reflex microscopic     Status: Abnormal   Collection Time: 09/04/16  2:21 PM  Result Value Ref Range   Color, Urine  YELLOW Yellow;Lt. Yellow   APPearance CLEAR Clear   Specific Gravity, Urine <=1.005 (A) 1.000 - 1.030   pH 6.0 5.0 - 8.0   Total Protein, Urine NEGATIVE Negative   Urine Glucose NEGATIVE Negative   Ketones, ur NEGATIVE Negative   Bilirubin Urine NEGATIVE Negative   Hgb urine dipstick NEGATIVE Negative   Urobilinogen, UA 0.2 0.0 - 1.0   Leukocytes, UA NEGATIVE Negative   Nitrite  NEGATIVE Negative   WBC, UA none seen 0-2/hpf   RBC / HPF none seen 0-2/hpf  HIV antibody (with reflex)     Status: None   Collection Time: 09/04/16  2:21 PM  Result Value Ref Range   HIV 1&2 Ab, 4th Generation NONREACTIVE NONREACTIVE    Comment:   HIV-1 antigen and HIV-1/HIV-2 antibodies were not detected.  There is no laboratory evidence of HIV infection.   HIV-1/2 Antibody Diff        Not indicated. HIV-1 RNA, Qual TMA          Not indicated.     PLEASE NOTE: This information has been disclosed to you from records whose confidentiality may be protected by state law. If your state requires such protection, then the state law prohibits you from making any further disclosure of the information without the specific written consent of the person to whom it pertains, or as otherwise permitted by law. A general authorization for the release of medical or other information is NOT sufficient for this purpose.   The performance of this assay has not been clinically validated in patients less than 15 years old.   For additional information please refer to http://education.[facility].com/faq/FAQ106.  (This link is being provided for informational/educational purposes only.)     Hepatitis C Antibody     Status: None   Collection Time: 09/04/16  2:21 PM  Result Value Ref Range   HCV Ab NEGATIVE NEGATIVE  Varicella zoster antibody, IgG     Status: Abnormal   Collection Time: 09/04/16  2:21 PM  Result Value Ref Range   Varicella IgG 1,140.00 (H) <135.00 Index    Comment:        Index           Interpretation      =====           ==============      < 135.00          Negative      135.00-164.99     Equivocal      >= 165.00         Positive   A positive result indicates that the patient has antibody to VZV but does not differentiate between an active or past infection. The clinical diagnosis must be interpreted in conjunction with the clinical signs and symptoms of the patient.  This assay reliably measures immunity due to previous infection but may not be sensitive enough to detect antibodies induced by vaccination. Thus, a negative result in a vaccinated individual does not necessarily indicate susceptibility to VZV infection.     LDL cholesterol, direct     Status: None   Collection Time: 09/04/16  2:21 PM  Result Value Ref Range   Direct LDL 103.0 mg/dL    Comment: Optimal:  <100 mg/dLNear or Above Optimal:  100-129 mg/dLBorderline High:  130-159 mg/dLHigh:  160-189 mg/dLVery High:  >190 mg/dL  Comp Met (CMET)     Status: Abnormal   Collection Time: 09/12/16 11:20 AM  Result Value Ref Range  Sodium 139 135 - 145 mEq/L   Potassium 4.1 3.5 - 5.1 mEq/L   Chloride 105 96 - 112 mEq/L   CO2 28 19 - 32 mEq/L   Glucose, Bld 56 (L) 70 - 99 mg/dL   BUN 16 6 - 23 mg/dL   Creatinine, Ser 0.99 0.40 - 1.50 mg/dL   Total Bilirubin 0.7 0.2 - 1.2 mg/dL   Alkaline Phosphatase 58 39 - 117 U/L   AST 21 0 - 37 U/L   ALT 24 0 - 53 U/L   Total Protein 6.7 6.0 - 8.3 g/dL   Albumin 4.5 3.5 - 5.2 g/dL   Calcium 9.3 8.4 - 10.5 mg/dL   GFR 81.44 >60.00 mL/min  Cologuard     Status: None   Collection Time: 09/18/16 12:00 AM  Result Value Ref Range   Cologuard Negative   POCT CBG (Fasting - Glucose)     Status: Normal   Collection Time: 09/19/16  8:54 AM  Result Value Ref Range   Glucose Fasting, POC 93 70 - 99 mg/dL    Assessment/Plan: 1. Thrush Mild. No further use of new mouthpiece. Start probiotic. Nystatin wash. OTC Peroxyl discussed. Follow-up if not resolving.  2. Balanitis Due to irritation. Rx Clotrimazole in case of start of yeast. Patient advised to stop messing with the area to allow healing.      Leeanne Rio, PA-C

## 2016-11-14 ENCOUNTER — Telehealth: Payer: Self-pay | Admitting: *Deleted

## 2016-11-14 NOTE — Telephone Encounter (Signed)
Pt called back, he does not want to do the magic mouthwash due to insurance not covering it and asking to try something else. Can try the Diflucan if covered by insurance.

## 2016-11-14 NOTE — Telephone Encounter (Signed)
Patient scheduled nurse visit

## 2016-11-14 NOTE — Telephone Encounter (Signed)
Patient states that the medication called in for the thrush has not worked.  He states that he is still the same, no change.   Asking if there is something else that can be called in.

## 2016-11-14 NOTE — Telephone Encounter (Signed)
Left detailed message on voicemail advising patient we can try the magic mouthwash again or try the Diflucan orally and see if improvements.

## 2016-11-14 NOTE — Telephone Encounter (Signed)
Notified patient that the Shingrix is available. He can schedule a nurse visit to get injection.

## 2016-11-14 NOTE — Telephone Encounter (Signed)
Would he be willing to let us try the magic mouthwash again? That was what we wanted to give him initially. The other option would be for me to send in a Diflucan for him to take orally. If no improvement after that, he will need reassessment.

## 2016-11-14 NOTE — Telephone Encounter (Signed)
Pt calling checking status on this and would like a call back so he will know to p/u at pharmacy.

## 2016-11-15 NOTE — Addendum Note (Signed)
Addended by: Waldon Merl on: 11/15/2016 08:28 AM   Modules accepted: Orders

## 2016-11-15 NOTE — Telephone Encounter (Signed)
Will you assess current symptoms -- if it just the white coating of tongue still or if he is noting any continued swollen sensation of tongue. If so we could attempt a cheaper formulation of the magic mouthwash as it has several different medications to help his symptoms.

## 2016-11-15 NOTE — Telephone Encounter (Signed)
Spoke with patient about current symptoms. He states he saw another provider and was treated for the symptoms.

## 2016-11-16 ENCOUNTER — Ambulatory Visit (INDEPENDENT_AMBULATORY_CARE_PROVIDER_SITE_OTHER): Payer: Managed Care, Other (non HMO) | Admitting: *Deleted

## 2016-11-16 DIAGNOSIS — Z23 Encounter for immunization: Secondary | ICD-10-CM

## 2016-11-29 ENCOUNTER — Other Ambulatory Visit: Payer: Self-pay | Admitting: Physician Assistant

## 2017-03-06 ENCOUNTER — Ambulatory Visit (INDEPENDENT_AMBULATORY_CARE_PROVIDER_SITE_OTHER): Payer: Managed Care, Other (non HMO) | Admitting: Emergency Medicine

## 2017-03-06 DIAGNOSIS — Z23 Encounter for immunization: Secondary | ICD-10-CM

## 2017-03-06 NOTE — Progress Notes (Addendum)
Patient presents today for his 2nd Shingrix vaccine. Shingrix was administered in Left deltoid. Patient tolerated injection well.  Above order was approved by me.  Neena RhymesKatherine Tabori, MD

## 2017-03-06 NOTE — Addendum Note (Signed)
Addended by: Sheliah HatchABORI, Kaityln Kallstrom E on: 03/06/2017 01:24 PM   Modules accepted: Level of Service

## 2017-03-08 ENCOUNTER — Telehealth: Payer: Self-pay

## 2017-03-08 NOTE — Telephone Encounter (Signed)
Patient reports feeling tired and achy since 2nd Shingrix injection 2 days ago, afebrile. Informed patient symptoms may be side effect of injection. Advised to try Ibuprofen, if symptoms continue or worsen to schedule appointment with PCP.

## 2017-03-08 NOTE — Telephone Encounter (Signed)
Agree w/ advice given.  This is a common side effect of this particular vaccine

## 2017-04-26 ENCOUNTER — Telehealth: Payer: Self-pay | Admitting: Physician Assistant

## 2017-04-26 MED ORDER — OMEPRAZOLE 10 MG PO CPDR: 10.0000 mg | DELAYED_RELEASE_CAPSULE | Freq: Every day | ORAL | 11 refills | Status: DC

## 2017-04-26 NOTE — Telephone Encounter (Signed)
Pt asking for a refill on prilosec, [facility] in [place].

## 2017-04-26 NOTE — Telephone Encounter (Signed)
Rx sent to the preferred patient pharmacy  

## 2017-05-25 ENCOUNTER — Other Ambulatory Visit: Payer: Self-pay | Admitting: Physician Assistant

## 2017-09-03 ENCOUNTER — Other Ambulatory Visit: Payer: Self-pay | Admitting: Physician Assistant

## 2017-09-03 MED ORDER — PRAVASTATIN SODIUM 20 MG PO TABS: ORAL_TABLET | ORAL | 0 refills | Status: DC

## 2017-09-23 ENCOUNTER — Encounter: Payer: Self-pay | Admitting: Emergency Medicine

## 2017-09-27 ENCOUNTER — Telehealth: Payer: Self-pay | Admitting: Family Medicine

## 2017-09-27 NOTE — Telephone Encounter (Signed)
See below. Marchelle Folksmanda to handle.   Copied from CRM [phone]. Topic: General - Other >> Sep 25, 2017 12:15 PM Cipriano BunkerLambe, Annette S wrote: Reason for CRM:   pt. Would like to see another provider as PCP  Dr. Mardelle MatteAndy preferred.   >> Sep 27, 2017  2:16 PM Idelia SalmDonovan, Elizabeth G wrote: Pt would like to switch care to Dr. Mardelle MatteAndy from [facility].  Please advise.

## 2017-09-29 ENCOUNTER — Other Ambulatory Visit: Payer: Self-pay | Admitting: Physician Assistant

## 2017-10-01 NOTE — Telephone Encounter (Signed)
Pt states that [facility] has [facility] Please disregard request.

## 2017-11-21 ENCOUNTER — Encounter: Payer: Self-pay | Admitting: Physician Assistant

## 2017-11-21 ENCOUNTER — Ambulatory Visit (INDEPENDENT_AMBULATORY_CARE_PROVIDER_SITE_OTHER): Payer: 59 | Admitting: Physician Assistant

## 2017-11-21 ENCOUNTER — Encounter: Payer: Self-pay | Admitting: Emergency Medicine

## 2017-11-21 ENCOUNTER — Other Ambulatory Visit: Payer: Self-pay

## 2017-11-21 VITALS — BP 130/80 | HR 65 | Temp 98.1°F | Resp 14 | Ht 67.0 in | Wt 176.0 lb

## 2017-11-21 DIAGNOSIS — Z125 Encounter for screening for malignant neoplasm of prostate: Secondary | ICD-10-CM

## 2017-11-21 DIAGNOSIS — Z0000 Encounter for general adult medical examination without abnormal findings: Secondary | ICD-10-CM | POA: Diagnosis not present

## 2017-11-21 DIAGNOSIS — E785 Hyperlipidemia, unspecified: Secondary | ICD-10-CM | POA: Diagnosis not present

## 2017-11-21 LAB — CBC WITH DIFFERENTIAL/PLATELET
BASOS PCT: 0.5 % (ref 0.0–3.0)
Basophils Absolute: 0 10*3/uL (ref 0.0–0.1)
EOS ABS: 0.2 10*3/uL (ref 0.0–0.7)
EOS PCT: 4.4 % (ref 0.0–5.0)
HCT: 42.5 % (ref 39.0–52.0)
HEMOGLOBIN: 14.6 g/dL (ref 13.0–17.0)
Lymphocytes Relative: 30.7 % (ref 12.0–46.0)
Lymphs Abs: 1.7 10*3/uL (ref 0.7–4.0)
MCHC: 34.4 g/dL (ref 30.0–36.0)
MCV: 89.7 fl (ref 78.0–100.0)
MONO ABS: 0.4 10*3/uL (ref 0.1–1.0)
Monocytes Relative: 7 % (ref 3.0–12.0)
NEUTROS ABS: 3.1 10*3/uL (ref 1.4–7.7)
NEUTROS PCT: 57.4 % (ref 43.0–77.0)
PLATELETS: 312 10*3/uL (ref 150.0–400.0)
RBC: 4.74 Mil/uL (ref 4.22–5.81)
RDW: 12.7 % (ref 11.5–15.5)
WBC: 5.5 10*3/uL (ref 4.0–10.5)

## 2017-11-21 LAB — LIPID PANEL
CHOLESTEROL: 184 mg/dL (ref 0–200)
HDL: 58 mg/dL (ref >39.00)
LDL Cholesterol: 110 mg/dL — ABNORMAL HIGH (ref 0–99)
NonHDL: 126.28
Total CHOL/HDL Ratio: 3
Triglycerides: 80 mg/dL (ref 0.0–149.0)
VLDL: 16 mg/dL (ref 0.0–40.0)

## 2017-11-21 LAB — COMPREHENSIVE METABOLIC PANEL
ALBUMIN: 4.3 g/dL (ref 3.5–5.2)
ALT: 17 U/L (ref 0–53)
AST: 17 U/L (ref 0–37)
Alkaline Phosphatase: 61 U/L (ref 39–117)
BUN: 13 mg/dL (ref 6–23)
CHLORIDE: 105 meq/L (ref 96–112)
CO2: 30 mEq/L (ref 19–32)
CREATININE: 0.99 mg/dL (ref 0.40–1.50)
Calcium: 9.5 mg/dL (ref 8.4–10.5)
GFR: 81.12 mL/min (ref >60.00)
GLUCOSE: 92 mg/dL (ref 70–99)
POTASSIUM: 4.9 meq/L (ref 3.5–5.1)
SODIUM: 140 meq/L (ref 135–145)
Total Bilirubin: 0.6 mg/dL (ref 0.2–1.2)
Total Protein: 6.6 g/dL (ref 6.0–8.3)

## 2017-11-21 LAB — PSA: PSA: 0.73 ng/mL (ref 0.10–4.00)

## 2017-11-21 LAB — HEMOGLOBIN A1C: HEMOGLOBIN A1C: 5.5 % (ref 4.6–6.5)

## 2017-11-21 NOTE — Assessment & Plan Note (Signed)
Depression screen negative. Health Maintenance reviewed. Preventive schedule discussed and handout given in AVS. Will obtain fasting labs today.  

## 2017-11-21 NOTE — Assessment & Plan Note (Signed)
The natural history of prostate cancer and ongoing controversy regarding screening and potential treatment outcomes of prostate cancer has been discussed with the patient. The meaning of a false positive PSA and a false negative PSA has been discussed. He indicates understanding of the limitations of this screening test and wishes  to proceed with screening PSA testing.  

## 2017-11-21 NOTE — Progress Notes (Signed)
Patient presents to clinic today for annual exam.  Patient is fasting for labs.  Acute Concerns: Denies fever, chills, aches, sinus pressure/pain. Runny nose, sneezing, watery eyes. Worse outside.  Has taken Benadryl with improvement in symptoms.   Chronic Issues: Hyperlipidemia -- Is working on weight training during the week. Martial arts on the weekends. Endorses well-balanced diet overall and good hydration. Body mass index is 27.57 kg/m. Is taking Pravachol as directed and tolerating well.   Health Maintenance: Immunizations -- Tetanus up-to-date. Cologuard -- 08/2016  Past Medical History:  Diagnosis Date  . GERD (gastroesophageal reflux disease)   . H/O necrotizing fasciitis 2009   right upper thigh  . Hyperlipidemia    lower with diet changes  . Wears glasses   . Wears partial dentures     Past Surgical History:  Procedure Laterality Date  . COLONOSCOPY     declines, will refer for Cologuard 09/2015  . LEG SURGERY  2009   right upper leg,necrotizing fascitis  . NASAL SINUS SURGERY     age 62yo    Current Outpatient Medications on File Prior to Visit  Medication Sig Dispense Refill  . omeprazole (PRILOSEC) 10 MG capsule Take 1 capsule (10 mg total) by mouth daily. 30 capsule 11  . pravastatin (PRAVACHOL) 20 MG tablet TAKE 1 TABLET BY MOUTH EVERY DAY 30 tablet 1   No current facility-administered medications on file prior to visit.     Allergies  Allergen Reactions  . Penicillins Rash    Family History  Problem Relation Age of Onset  . Stroke Mother   . Dementia Mother        vascular  . Cancer Father 2       died of bladder cancer  . Osteoporosis Sister   . Diabetes Neg Hx   . Heart disease Neg Hx   . Hypertension Neg Hx   . Colon cancer Neg Hx     Social History   Socioeconomic History  . Marital status: Married    Spouse name: Not on file  . Number of children: Not on file  . Years of education: Not on file  . Highest education level:  Not on file  Occupational History  . Not on file  Social Needs  . Financial resource strain: Not on file  . Food insecurity:    Worry: Not on file    Inability: Not on file  . Transportation needs:    Medical: Not on file    Non-medical: Not on file  Tobacco Use  . Smoking status: Former Smoker    Packs/day: 0.50    Years: 15.00    Pack years: 7.50    Start date: 06/20/2004  . Smokeless tobacco: Never Used  Substance and Sexual Activity  . Alcohol use: No    Alcohol/week: 0.0 oz  . Drug use: No  . Sexual activity: Not Currently  Lifestyle  . Physical activity:    Days per week: Not on file    Minutes per session: Not on file  . Stress: Not on file  Relationships  . Social connections:    Talks on phone: Not on file    Gets together: Not on file    Attends religious service: Not on file    Active member of club or organization: Not on file    Attends meetings of clubs or organizations: Not on file    Relationship status: Not on file  . Intimate partner violence:  Fear of current or ex partner: Not on file    Emotionally abused: Not on file    Physically abused: Not on file    Forced sexual activity: Not on file  Other Topics Concern  . Not on file  Social History Narrative   Married, 2 children from ex wife, exercise - 5 days per week, 40-50 minutes, kung fu, weight training.  Walk nightly.   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  Was working as a Midwifebus driver for [employer]   Review of Systems  Constitutional: Negative for fever and weight loss.  HENT: Negative for ear discharge, ear pain, hearing loss and tinnitus.   Eyes: Negative for blurred vision, double vision, photophobia and pain.  Respiratory: Negative for cough and shortness of breath.   Cardiovascular: Negative for chest pain and palpitations.  Gastrointestinal: Negative for abdominal pain, blood in stool, constipation, diarrhea, heartburn, melena, nausea and vomiting.  Genitourinary: Negative for dysuria,  flank pain, frequency, hematuria and urgency.  Musculoskeletal: Negative for falls.  Neurological: Negative for dizziness, loss of consciousness and headaches.  Endo/Heme/Allergies: Negative for environmental allergies.  Psychiatric/Behavioral: Negative for depression, hallucinations, substance abuse and suicidal ideas. The patient is not nervous/anxious and does not have insomnia.    BP 130/80   Pulse 65   Temp 98.1 F (36.7 C) (Oral)   Resp 14   Ht 5\' 7"  (1.702 m)   Wt 176 lb (79.8 kg)   SpO2 99%   BMI 27.57 kg/m   Physical Exam  Constitutional: He is oriented to person, place, and time. He appears well-developed and well-nourished. No distress.  HENT:  Head: Normocephalic and atraumatic.  Right Ear: Tympanic membrane, external ear and ear canal normal.  Left Ear: Tympanic membrane, external ear and ear canal normal.  Nose: Nose normal.  Mouth/Throat: Oropharynx is clear and moist and mucous membranes are normal. No posterior oropharyngeal edema or posterior oropharyngeal erythema.  Eyes: Pupils are equal, round, and reactive to light. Conjunctivae are normal.  Neck: Neck supple. No thyromegaly present.  Cardiovascular: Normal rate, regular rhythm, normal heart sounds and intact distal pulses.  Pulmonary/Chest: Effort normal and breath sounds normal. No respiratory distress. He has no wheezes. He has no rales. He exhibits no tenderness.  Abdominal: Soft. Bowel sounds are normal. He exhibits no distension and no mass. There is no tenderness. There is no rebound and no guarding.  Lymphadenopathy:    He has no cervical adenopathy.  Neurological: He is alert and oriented to person, place, and time. No cranial nerve deficit.  Skin: Skin is warm and dry. No rash noted. He is not diaphoretic.  Psychiatric: He has a normal mood and affect.  Vitals reviewed.  Assessment/Plan: Visit for preventive health examination Depression screen negative. Health Maintenance reviewed. Preventive  schedule discussed and handout given in AVS. Will obtain fasting labs today.   Prostate cancer screening The natural history of prostate cancer and ongoing controversy regarding screening and potential treatment outcomes of prostate cancer has been discussed with the patient. The meaning of a false positive PSA and a false negative PSA has been discussed. He indicates understanding of the limitations of this screening test and wishes to proceed with screening PSA testing.   Hyperlipidemia Taking medications as directed. Keep up with diet and exercise. Will check fasting lipids and LFTs today.    Piedad ClimesWilliam Cody Chesley Valls, PA-C

## 2017-11-21 NOTE — Patient Instructions (Signed)
Please go to the lab for blood work.   Our office will call you with your results unless you have chosen to receive results via MyChart.  If your blood work is normal we will follow-up each year for physicals and as scheduled for chronic medical problems.  If anything is abnormal we will treat accordingly and get you in for a follow-up.  Please continue cholesterol medication as directed. Keep up with diet and exercise.   For allergy symptoms, you can start an over-the-counter Claritin or Flonase (nasal spray). Keep well-hydrated. Wear a mask when doing yardwork.  Try out the capful of hydrogen peroxide for the ear wax.    Preventive Care 40-64 Years, Male Preventive care refers to lifestyle choices and visits with your health care provider that can promote health and wellness. What does preventive care include?  A yearly physical exam. This is also called an annual well check.  Dental exams once or twice a year.  Routine eye exams. Ask your health care provider how often you should have your eyes checked.  Personal lifestyle choices, including: ? Daily care of your teeth and gums. ? Regular physical activity. ? Eating a healthy diet. ? Avoiding tobacco and drug use. ? Limiting alcohol use. ? Practicing safe sex. ? Taking low-dose aspirin every day starting at age 50. What happens during an annual well check? The services and screenings done by your health care provider during your annual well check will depend on your age, overall health, lifestyle risk factors, and family history of disease. Counseling Your health care provider may ask you questions about your:  Alcohol use.  Tobacco use.  Drug use.  Emotional well-being.  Home and relationship well-being.  Sexual activity.  Eating habits.  Work and work environment.  Screening You may have the following tests or measurements:  Height, weight, and BMI.  Blood pressure.  Lipid and cholesterol levels.  These may be checked every 5 years, or more frequently if you are over 50 years old.  Skin check.  Lung cancer screening. You may have this screening every year starting at age 55 if you have a 30-pack-year history of smoking and currently smoke or have quit within the past 15 years.  Fecal occult blood test (FOBT) of the stool. You may have this test every year starting at age 50.  Flexible sigmoidoscopy or colonoscopy. You may have a sigmoidoscopy every 5 years or a colonoscopy every 10 years starting at age 50.  Prostate cancer screening. Recommendations will vary depending on your family history and other risks.  Hepatitis C blood test.  Hepatitis B blood test.  Sexually transmitted disease (STD) testing.  Diabetes screening. This is done by checking your blood sugar (glucose) after you have not eaten for a while (fasting). You may have this done every 1-3 years.  Discuss your test results, treatment options, and if necessary, the need for more tests with your health care provider. Vaccines Your health care provider may recommend certain vaccines, such as:  Influenza vaccine. This is recommended every year.  Tetanus, diphtheria, and acellular pertussis (Tdap, Td) vaccine. You may need a Td booster every 10 years.  Varicella vaccine. You may need this if you have not been vaccinated.  Zoster vaccine. You may need this after age 60.  Measles, mumps, and rubella (MMR) vaccine. You may need at least one dose of MMR if you were born in 1957 or later. You may also need a second dose.  Pneumococcal 13-valent conjugate (  PCV13) vaccine. You may need this if you have certain conditions and have not been vaccinated.  Pneumococcal polysaccharide (PPSV23) vaccine. You may need one or two doses if you smoke cigarettes or if you have certain conditions.  Meningococcal vaccine. You may need this if you have certain conditions.  Hepatitis A vaccine. You may need this if you have certain  conditions or if you travel or work in places where you may be exposed to hepatitis A.  Hepatitis B vaccine. You may need this if you have certain conditions or if you travel or work in places where you may be exposed to hepatitis B.  Haemophilus influenzae type b (Hib) vaccine. You may need this if you have certain risk factors.  Talk to your health care provider about which screenings and vaccines you need and how often you need them. This information is not intended to replace advice given to you by your health care provider. Make sure you discuss any questions you have with your health care provider. Document Released: 08/12/2015 Document Revised: 04/04/2016 Document Reviewed: 05/17/2015 Elsevier Interactive Patient Education  2018 Elsevier Inc.       

## 2017-11-21 NOTE — Assessment & Plan Note (Signed)
Taking medications as directed. Keep up with diet and exercise. Will check fasting lipids and LFTs today.

## 2017-11-29 ENCOUNTER — Other Ambulatory Visit: Payer: Self-pay | Admitting: Physician Assistant

## 2017-12-27 ENCOUNTER — Other Ambulatory Visit: Payer: Self-pay | Admitting: Physician Assistant

## 2018-01-28 ENCOUNTER — Other Ambulatory Visit: Payer: Self-pay | Admitting: Physician Assistant

## 2018-05-20 ENCOUNTER — Telehealth: Payer: Self-pay | Admitting: *Deleted

## 2018-05-20 NOTE — Telephone Encounter (Signed)
Copied from CRM [id number]. Topic: General - Other >> May 20, 2018  1:26 PM Arlyss Gandy, NT wrote: Reason for CRM: Pt would like to see if an order for a colonoscopy can be placed for him. Please advise pt.

## 2018-05-20 NOTE — Telephone Encounter (Signed)
Chart reviewed.  Had CPE in April. Has documented negative cologuard in 2018; thus no need for next colorectal cancer screening until 2021. Can schedule OV with cody if he has further concerns or questions.

## 2018-05-21 NOTE — Telephone Encounter (Signed)
Spoke with patient.  He refused to make an appointment because he says he has a high deductible insurance plan and he does not want to get hit with a $2000 bill.   He states that his insurance told him he could have another physical after January 3rd, and if his PCP will order a colonoscopy as preventative then they will cover it.     Patient was advised that he is not due for one until 2021, but he insists that he wants one, and that his insurance will cover it.   He states that he will call in a couple months to schedule his physical.

## 2018-05-21 NOTE — Telephone Encounter (Signed)
Will defer to cody.

## 2018-05-22 NOTE — Telephone Encounter (Signed)
Ok to place elective referral to GI. Since he had cologuard within past 3 years I do not think insurance will cover but GI can review with him since he is adamant

## 2018-05-23 DIAGNOSIS — R21 Rash and other nonspecific skin eruption: Secondary | ICD-10-CM | POA: Diagnosis not present

## 2018-05-23 NOTE — Telephone Encounter (Signed)
Spoke with patient to give advice from PCP.  Patient refuses referral to GI because he says insurance will not pay.   He states that his insurance told him yes he could have this after January.   I advised that I was not sure if we could do a "preventative" since he has already had the cologuard, but he still insists that they will.  Again I offered to send him to GI as they could handle this, pt again refused stating that he will not pay to go see them. He states that he will wait until after the new year and will call to schedule an appointment with [facility].

## 2018-05-28 DIAGNOSIS — L309 Dermatitis, unspecified: Secondary | ICD-10-CM | POA: Diagnosis not present

## 2018-05-28 DIAGNOSIS — R194 Change in bowel habit: Secondary | ICD-10-CM | POA: Diagnosis not present

## 2018-05-29 ENCOUNTER — Encounter: Payer: Self-pay | Admitting: Family Medicine

## 2018-05-29 ENCOUNTER — Other Ambulatory Visit: Payer: Self-pay

## 2018-05-29 ENCOUNTER — Ambulatory Visit (INDEPENDENT_AMBULATORY_CARE_PROVIDER_SITE_OTHER): Payer: 59 | Admitting: Family Medicine

## 2018-05-29 VITALS — BP 125/80 | HR 65 | Temp 98.1°F | Resp 16 | Ht 67.0 in | Wt 175.0 lb

## 2018-05-29 DIAGNOSIS — L299 Pruritus, unspecified: Secondary | ICD-10-CM | POA: Diagnosis not present

## 2018-05-29 DIAGNOSIS — R159 Full incontinence of feces: Secondary | ICD-10-CM | POA: Diagnosis not present

## 2018-05-29 DIAGNOSIS — L290 Pruritus ani: Secondary | ICD-10-CM | POA: Diagnosis not present

## 2018-05-29 DIAGNOSIS — R195 Other fecal abnormalities: Secondary | ICD-10-CM

## 2018-05-29 MED ORDER — NYSTATIN 100000 UNIT/GM EX POWD: Freq: Three times a day (TID) | CUTANEOUS | 0 refills | Status: DC

## 2018-05-29 MED ORDER — NYSTATIN-TRIAMCINOLONE 100000-0.1 UNIT/GM-% EX OINT: 1.0000 "application " | TOPICAL_OINTMENT | Freq: Two times a day (BID) | CUTANEOUS | 0 refills | Status: DC

## 2018-05-29 NOTE — Patient Instructions (Signed)
Follow up with Jared Coffey if no improvement in 10-14 days We'll notify you of your lab results and make any changes if needed APPLY the combination cream twice daily to improve the itching USE the powder 2-3x/day to prevent sweating and rashes TAKE Benadryl at night to prevent itching Call with any questions or concerns Hang in there!

## 2018-05-29 NOTE — Progress Notes (Signed)
   Subjective:    Patient ID: Jared Coffey, male    DOB: 16-Apr-1956, 62 y.o.   MRN: [id number]  HPI Rectal Itching- sxs started 15-20 days ago. Called [facility] and was prescribed Ketoconazole- no improvement after 3.5 days.  Called back and was told to use A&D.  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  Last night got up at 2am w/ severe itching and noticed that head of penis was itching 'internally' and then legs were each itching.  No rash on skin.  No current hemorrhoids  Bowel leakage- this started ~1 yr ago.  Reports he will have small amount of leakage after each BM.  Pt reports this does not stain his underwear, 'it just stays up there'.  Not interested in additional workup.  Clay colored stools- noticed 10 days ago.  Only 1 episode and 'only 1/2 the stool'.     Review of Systems For ROS see HPI     Objective:   Physical Exam  Constitutional: He is oriented to person, place, and time. He appears well-developed and well-nourished. No distress.  HENT:  Head: Normocephalic and atraumatic.  Abdominal: Soft. Bowel sounds are normal. He exhibits no distension. There is no tenderness. There is no guarding.  Genitourinary:  Genitourinary Comments: Fungal dermatitis of perineum  Neurological: He is alert and oriented to person, place, and time.  Skin: Skin is warm and dry. No erythema.  Vitals reviewed.         Assessment & Plan:  Rectal itching- upon exam, this appears a fungal dermatitis.  Due to the extreme itching, will start Mycolog and once area has improved, he is to use Nystatin powder to keep area dry while he is driving.  Fecal incontinence- pt reports this is ongoing for over a year but is not interested in GI workup at this time.  Encouraged him to monitor sxs and f/u w/ PCP if needed  Clay colored stools- pt reports 1 episode ~10 days ago.  Has not recurred.  Check LFTs and tx any abnormalities if present

## 2018-05-30 LAB — COMPREHENSIVE METABOLIC PANEL
AG Ratio: 2.4 (calc) (ref 1.0–2.5)
ALT: 17 U/L (ref 9–46)
AST: 20 U/L (ref 10–35)
Albumin: 4.5 g/dL (ref 3.6–5.1)
Alkaline phosphatase (APISO): 60 U/L (ref 40–115)
BUN: 16 mg/dL (ref 7–25)
CO2: 28 mmol/L (ref 20–32)
Calcium: 9.5 mg/dL (ref 8.6–10.3)
Chloride: 105 mmol/L (ref 98–110)
Creat: 1.01 mg/dL (ref 0.70–1.25)
Globulin: 1.9 g/dL (calc) (ref 1.9–3.7)
Glucose, Bld: 88 mg/dL (ref 65–99)
Potassium: 4.4 mmol/L (ref 3.5–5.3)
Sodium: 142 mmol/L (ref 135–146)
Total Bilirubin: 0.5 mg/dL (ref 0.2–1.2)
Total Protein: 6.4 g/dL (ref 6.1–8.1)

## 2018-05-30 LAB — CBC WITH DIFFERENTIAL/PLATELET
Basophils Absolute: 29 cells/uL (ref 0–200)
Basophils Relative: 0.4 %
EOS PCT: 5.5 %
Eosinophils Absolute: 402 cells/uL (ref 15–500)
HEMATOCRIT: 41 % (ref 38.5–50.0)
HEMOGLOBIN: 14 g/dL (ref 13.2–17.1)
LYMPHS ABS: 2285 {cells}/uL (ref 850–3900)
MCH: 30.1 pg (ref 27.0–33.0)
MCHC: 34.1 g/dL (ref 32.0–36.0)
MCV: 88.2 fL (ref 80.0–100.0)
MONOS PCT: 9.2 %
MPV: 9.7 fL (ref 7.5–12.5)
NEUTROS ABS: 3913 {cells}/uL (ref 1500–7800)
Neutrophils Relative %: 53.6 %
Platelets: 318 10*3/uL (ref 140–400)
RBC: 4.65 10*6/uL (ref 4.20–5.80)
RDW: 12.2 % (ref 11.0–15.0)
Total Lymphocyte: 31.3 %
WBC mixed population: 672 cells/uL (ref 200–950)
WBC: 7.3 10*3/uL (ref 3.8–10.8)

## 2018-07-31 ENCOUNTER — Other Ambulatory Visit: Payer: Self-pay | Admitting: Family Medicine

## 2018-07-31 ENCOUNTER — Other Ambulatory Visit: Payer: Self-pay | Admitting: Physician Assistant

## 2018-07-31 NOTE — Telephone Encounter (Signed)
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 Pt requesting call when RX is sent in.   Has the patient contacted their pharmacy? Yes - told they need new RX Preferred Pharmacy (with phone number or street name): [id number] - [place], [place] - [address] [phone] (Phone) [phone] (Fax)

## 2018-08-01 ENCOUNTER — Other Ambulatory Visit: Payer: Self-pay | Admitting: Physician Assistant

## 2018-08-01 NOTE — Telephone Encounter (Signed)
Please advise if ok to fill?  

## 2018-08-01 NOTE — Telephone Encounter (Signed)
Pt came in asking for if this could be done today. 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 Pt uses in [place]. He needs the the Ointment and the  Pravastatin.

## 2018-08-01 NOTE — Telephone Encounter (Signed)
Per Dr. Beverely Low. Rx printed and given to pt.

## 2018-08-01 NOTE — Telephone Encounter (Signed)
Copied from CRM [id number]. Topic: Quick Communication - Rx Refill/Question >> Aug 01, 2018  8:42 AM Jens SomMedley, Jennifer A wrote: Medication: nystatin-triamcinolone ointment [facility](MYCOLOG) [[id number]][[id number]]   Has the patient contacted their pharmacy? Yes (Agent: If no, request that the patient contact the pharmacy for the refill.) (Agent: If yes, when and what did the pharmacy advise?)  Preferred Pharmacy (with phone number or street name): [id number] - [place], [place] - [address] [phone] (Phone) [phone] (Fax)    Agent: Please be advised that RX refills may take up to 3 business days. We ask that you follow-up with your pharmacy.

## 2018-08-08 ENCOUNTER — Encounter: Payer: Self-pay | Admitting: Emergency Medicine

## 2018-08-08 ENCOUNTER — Ambulatory Visit (INDEPENDENT_AMBULATORY_CARE_PROVIDER_SITE_OTHER): Payer: 59 | Admitting: Physician Assistant

## 2018-08-08 ENCOUNTER — Other Ambulatory Visit: Payer: Self-pay

## 2018-08-08 ENCOUNTER — Encounter: Payer: Self-pay | Admitting: Physician Assistant

## 2018-08-08 VITALS — BP 130/80 | HR 67 | Temp 98.0°F | Resp 14 | Ht 67.0 in | Wt 179.0 lb

## 2018-08-08 DIAGNOSIS — E785 Hyperlipidemia, unspecified: Secondary | ICD-10-CM

## 2018-08-08 DIAGNOSIS — Z0000 Encounter for general adult medical examination without abnormal findings: Secondary | ICD-10-CM | POA: Diagnosis not present

## 2018-08-08 DIAGNOSIS — Z125 Encounter for screening for malignant neoplasm of prostate: Secondary | ICD-10-CM

## 2018-08-08 DIAGNOSIS — K648 Other hemorrhoids: Secondary | ICD-10-CM | POA: Diagnosis not present

## 2018-08-08 LAB — CBC WITH DIFFERENTIAL/PLATELET
BASOS ABS: 0 10*3/uL (ref 0.0–0.1)
Basophils Relative: 0.4 % (ref 0.0–3.0)
Eosinophils Absolute: 0.3 10*3/uL (ref 0.0–0.7)
Eosinophils Relative: 6.1 % — ABNORMAL HIGH (ref 0.0–5.0)
HEMATOCRIT: 44.2 % (ref 39.0–52.0)
Hemoglobin: 15.1 g/dL (ref 13.0–17.0)
LYMPHS PCT: 33 % (ref 12.0–46.0)
Lymphs Abs: 1.6 10*3/uL (ref 0.7–4.0)
MCHC: 34.2 g/dL (ref 30.0–36.0)
MCV: 89.1 fl (ref 78.0–100.0)
MONOS PCT: 9.5 % (ref 3.0–12.0)
Monocytes Absolute: 0.5 10*3/uL (ref 0.1–1.0)
NEUTROS ABS: 2.4 10*3/uL (ref 1.4–7.7)
Neutrophils Relative %: 51 % (ref 43.0–77.0)
Platelets: 307 10*3/uL (ref 150.0–400.0)
RBC: 4.96 Mil/uL (ref 4.22–5.81)
RDW: 13.1 % (ref 11.5–15.5)
WBC: 4.8 10*3/uL (ref 4.0–10.5)

## 2018-08-08 LAB — COMPREHENSIVE METABOLIC PANEL
ALT: 29 U/L (ref 0–53)
AST: 24 U/L (ref 0–37)
Albumin: 4.4 g/dL (ref 3.5–5.2)
Alkaline Phosphatase: 68 U/L (ref 39–117)
BILIRUBIN TOTAL: 0.6 mg/dL (ref 0.2–1.2)
BUN: 14 mg/dL (ref 6–23)
CALCIUM: 9.8 mg/dL (ref 8.4–10.5)
CO2: 28 mEq/L (ref 19–32)
Chloride: 105 mEq/L (ref 96–112)
Creatinine, Ser: 0.99 mg/dL (ref 0.40–1.50)
GFR: 80.94 mL/min (ref >60.00)
Glucose, Bld: 84 mg/dL (ref 70–99)
Potassium: 4.7 mEq/L (ref 3.5–5.1)
Sodium: 142 mEq/L (ref 135–145)
TOTAL PROTEIN: 6.7 g/dL (ref 6.0–8.3)

## 2018-08-08 LAB — LIPID PANEL
CHOLESTEROL: 194 mg/dL (ref 0–200)
HDL: 64 mg/dL (ref >39.00)
LDL CALC: 112 mg/dL — AB (ref 0–99)
NonHDL: 129.72
TRIGLYCERIDES: 87 mg/dL (ref 0.0–149.0)
Total CHOL/HDL Ratio: 3
VLDL: 17.4 mg/dL (ref 0.0–40.0)

## 2018-08-08 LAB — PSA: PSA: 0.68 ng/mL (ref 0.10–4.00)

## 2018-08-08 LAB — HEMOGLOBIN A1C: Hgb A1c MFr Bld: 5.6 % (ref 4.6–6.5)

## 2018-08-08 NOTE — Patient Instructions (Signed)
Please go to the lab for blood work.  Our office will call you with your results unless you have chosen to receive results via MyChart. If your blood work is normal we will follow-up each year for physicals and as scheduled for chronic medical problems. If anything is abnormal we will treat accordingly and get you in for a follow-up.  Please keep well-hydrated and get plenty of rest.  Eat a high fiber diet. Continue your exercises. I am giving you a steroid suppository to use for 6 days in case there is a small hemorrhoid inside that we cannot feel. If symptoms persist, we will want to set you up with a colorectal specialist.   Preventive Care 40-64 Years, Male Preventive care refers to lifestyle choices and visits with your health care provider that can promote health and wellness. What does preventive care include?   A yearly physical exam. This is also called an annual well check.  Dental exams once or twice a year.  Routine eye exams. Ask your health care provider how often you should have your eyes checked.  Personal lifestyle choices, including: ? Daily care of your teeth and gums. ? Regular physical activity. ? Eating a healthy diet. ? Avoiding tobacco and drug use. ? Limiting alcohol use. ? Practicing safe sex. ? Taking low-dose aspirin every day starting at age 50. What happens during an annual well check? The services and screenings done by your health care provider during your annual well check will depend on your age, overall health, lifestyle risk factors, and family history of disease. Counseling Your health care provider may ask you questions about your:  Alcohol use.  Tobacco use.  Drug use.  Emotional well-being.  Home and relationship well-being.  Sexual activity.  Eating habits.  Work and work Statistician. Screening You may have the following tests or measurements:  Height, weight, and BMI.  Blood pressure.  Lipid and cholesterol levels.  These may be checked every 5 years, or more frequently if you are over 75 years old.  Skin check.  Lung cancer screening. You may have this screening every year starting at age 18 if you have a 30-pack-year history of smoking and currently smoke or have quit within the past 15 years.  Colorectal cancer screening. All adults should have this screening starting at age 39 and continuing until age 50. Your health care provider may recommend screening at age 60. You will have tests every 1-10 years, depending on your results and the type of screening test. People at increased risk should start screening at an earlier age. Screening tests may include: ? Guaiac-based fecal occult blood testing. ? Fecal immunochemical test (FIT). ? Stool DNA test. ? Virtual colonoscopy. ? Sigmoidoscopy. During this test, a flexible tube with a tiny camera (sigmoidoscope) is used to examine your rectum and lower colon. The sigmoidoscope is inserted through your anus into your rectum and lower colon. ? Colonoscopy. During this test, a long, thin, flexible tube with a tiny camera (colonoscope) is used to examine your entire colon and rectum.  Prostate cancer screening. Recommendations will vary depending on your family history and other risks.  Hepatitis C blood test.  Hepatitis B blood test.  Sexually transmitted disease (STD) testing.  Diabetes screening. This is done by checking your blood sugar (glucose) after you have not eaten for a while (fasting). You may have this done every 1-3 years. Discuss your test results, treatment options, and if necessary, the need for more tests with your  health care provider. Vaccines Your health care provider may recommend certain vaccines, such as:  Influenza vaccine. This is recommended every year.  Tetanus, diphtheria, and acellular pertussis (Tdap, Td) vaccine. You may need a Td booster every 10 years.  Varicella vaccine. You may need this if you have not been  vaccinated.  Zoster vaccine. You may need this after age 49.  Measles, mumps, and rubella (MMR) vaccine. You may need at least one dose of MMR if you were born in 1957 or later. You may also need a second dose.  Pneumococcal 13-valent conjugate (PCV13) vaccine. You may need this if you have certain conditions and have not been vaccinated.  Pneumococcal polysaccharide (PPSV23) vaccine. You may need one or two doses if you smoke cigarettes or if you have certain conditions.  Meningococcal vaccine. You may need this if you have certain conditions.  Hepatitis A vaccine. You may need this if you have certain conditions or if you travel or work in places where you may be exposed to hepatitis A.  Hepatitis B vaccine. You may need this if you have certain conditions or if you travel or work in places where you may be exposed to hepatitis B.  Haemophilus influenzae type b (Hib) vaccine. You may need this if you have certain risk factors. Talk to your health care provider about which screenings and vaccines you need and how often you need them. This information is not intended to replace advice given to you by your health care provider. Make sure you discuss any questions you have with your health care provider. Document Released: 08/12/2015 Document Revised: 09/05/2017 Document Reviewed: 05/17/2015 Elsevier Interactive Patient Education  2019 [employer].

## 2018-08-08 NOTE — Progress Notes (Signed)
Patient presents to clinic today for annual exam.  Patient is fasting for labs.  Acute Concerns: Patient still noting issue with occasional leakage of stool. Notes occasional BRBPR. Denies melena, tenesmus. Denies frequent or loose stool. Denies abdominal pain, nausea or vomiting. Has history of hemorrhoid.  Chronic Issues: Hyperlipidemia -- Currently on Pravastatin 20 mg daily. Endorses taking medications as directed.   Health Maintenance: Colonoscopy -- up-to-date.  Past Medical History:  Diagnosis Date  . GERD (gastroesophageal reflux disease)   . H/O necrotizing fasciitis 2009   right upper thigh  . Hyperlipidemia    lower with diet changes  . Wears glasses   . Wears partial dentures     Past Surgical History:  Procedure Laterality Date  . COLONOSCOPY     declines, will refer for Cologuard 09/2015  . LEG SURGERY  2009   right upper leg,necrotizing fascitis  . NASAL SINUS SURGERY     age 39yo    Current Outpatient Medications on File Prior to Visit  Medication Sig Dispense Refill  . nystatin-triamcinolone ointment (MYCOLOG) Apply externally to the affected area twice daily. 60 g 0  . omeprazole (PRILOSEC) 10 MG capsule Take 1 capsule (10 mg total) by mouth daily. 30 capsule 11  . pravastatin (PRAVACHOL) 20 MG tablet TAKE 1 TABLET BY MOUTH EVERY DAY 90 tablet 1   No current facility-administered medications on file prior to visit.     Allergies  Allergen Reactions  . Penicillins Rash    Family History  Problem Relation Age of Onset  . Stroke Mother   . Dementia Mother        vascular  . Cancer Father 26       died of bladder cancer  . Osteoporosis Sister   . Diabetes Neg Hx   . Heart disease Neg Hx   . Hypertension Neg Hx   . Colon cancer Neg Hx     Social History   Socioeconomic History  . Marital status: Married    Spouse name: Not on file  . Number of children: Not on file  . Years of education: Not on file  . Highest education level: Not  on file  Occupational History  . Not on file  Social Needs  . Financial resource strain: Not on file  . Food insecurity:    Worry: Not on file    Inability: Not on file  . Transportation needs:    Medical: Not on file    Non-medical: Not on file  Tobacco Use  . Smoking status: Former Smoker    Packs/day: 0.50    Years: 15.00    Pack years: 7.50    Start date: 06/20/2004  . Smokeless tobacco: Never Used  Substance and Sexual Activity  . Alcohol use: No    Alcohol/week: 0.0 standard drinks  . Drug use: No  . Sexual activity: Not Currently  Lifestyle  . Physical activity:    Days per week: Not on file    Minutes per session: Not on file  . Stress: Not on file  Relationships  . Social connections:    Talks on phone: Not on file    Gets together: Not on file    Attends religious service: Not on file    Active member of club or organization: Not on file    Attends meetings of clubs or organizations: Not on file    Relationship status: Not on file  . Intimate partner violence:    Fear of  current or ex partner: Not on file    Emotionally abused: Not on file    Physically abused: Not on file    Forced sexual activity: Not on file  Other Topics Concern  . Not on file  Social History Narrative   Married, 2 children from ex wife, exercise - 5 days per week, 40-50 minutes, kung fu, weight training.  Walk nightly.   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  HENT: Negative for ear discharge, ear pain, hearing loss and tinnitus.   Eyes: Negative for blurred vision, double vision, photophobia and pain.  Respiratory: Negative for cough and shortness of breath.   Cardiovascular: Negative for chest pain and palpitations.  Gastrointestinal: Positive for blood in stool (occasional BRBPR). Negative for abdominal pain, constipation, diarrhea, heartburn, melena, nausea and  vomiting.  Genitourinary: Negative for dysuria, flank pain, frequency, hematuria and urgency.  Musculoskeletal: Negative for falls.  Neurological: Negative for dizziness, loss of consciousness and headaches.  Endo/Heme/Allergies: Negative for environmental allergies.  Psychiatric/Behavioral: Negative for depression, hallucinations, substance abuse and suicidal ideas. The patient is not nervous/anxious and does not have insomnia.     BP 130/80   Pulse 67   Temp 98 F (36.7 C) (Oral)   Resp 14   Ht '5\' 7"'$  (1.702 m)   Wt 179 lb (81.2 kg)   SpO2 99%   BMI 28.04 kg/m   Physical Exam Vitals signs reviewed. Exam conducted with a chaperone present.  Constitutional:      General: He is not in acute distress.    Appearance: Normal appearance. He is well-developed. He is not diaphoretic.  HENT:     Head: Normocephalic and atraumatic.     Right Ear: Tympanic membrane, ear canal and external ear normal.     Left Ear: Tympanic membrane, ear canal and external ear normal.     Nose: Nose normal.     Mouth/Throat:     Pharynx: No posterior oropharyngeal erythema.  Eyes:     Conjunctiva/sclera: Conjunctivae normal.     Pupils: Pupils are equal, round, and reactive to light.  Neck:     Musculoskeletal: Neck supple.     Thyroid: No thyromegaly.  Cardiovascular:     Rate and Rhythm: Normal rate and regular rhythm.     Heart sounds: Normal heart sounds.  Pulmonary:     Effort: Pulmonary effort is normal. No respiratory distress.     Breath sounds: Normal breath sounds. No wheezing or rales.  Chest:     Chest wall: No tenderness.  Abdominal:     General: Bowel sounds are normal. There is no distension.     Palpations: Abdomen is soft. There is no mass.     Tenderness: There is no abdominal tenderness. There is no guarding or rebound.  Genitourinary:    Pubic Area: No rash.      Prostate: Normal.     Rectum: Guaiac result negative. Internal hemorrhoid present. No mass, tenderness, anal  fissure or external hemorrhoid. Normal anal tone.  Lymphadenopathy:     Cervical: No cervical adenopathy.  Skin:    General: Skin is warm and dry.     Findings: No rash.  Neurological:     Mental Status: He is alert and oriented to person, place, and time.     Cranial Nerves: No cranial nerve deficit.     Recent Results (  from the past 2160 hour(s))  Comp Met (CMET)     Status: None   Collection Time: 05/29/18  3:32 PM  Result Value Ref Range   Glucose, Bld 88 65 - 99 mg/dL    Comment: .            Fasting reference interval .    BUN 16 7 - 25 mg/dL   Creat 1.01 0.70 - 1.25 mg/dL    Comment: For patients >61 years of age, the reference limit for Creatinine is approximately 13% higher for people identified as African-American. .    BUN/Creatinine Ratio NOT APPLICABLE 6 - 22 (calc)   Sodium 142 135 - 146 mmol/L   Potassium 4.4 3.5 - 5.3 mmol/L   Chloride 105 98 - 110 mmol/L   CO2 28 20 - 32 mmol/L   Calcium 9.5 8.6 - 10.3 mg/dL   Total Protein 6.4 6.1 - 8.1 g/dL   Albumin 4.5 3.6 - 5.1 g/dL   Globulin 1.9 1.9 - 3.7 g/dL (calc)   AG Ratio 2.4 1.0 - 2.5 (calc)   Total Bilirubin 0.5 0.2 - 1.2 mg/dL   Alkaline phosphatase (APISO) 60 40 - 115 U/L   AST 20 10 - 35 U/L   ALT 17 9 - 46 U/L  CBC with Differential/Platelet     Status: None   Collection Time: 05/29/18  3:32 PM  Result Value Ref Range   WBC 7.3 3.8 - 10.8 Thousand/uL   RBC 4.65 4.20 - 5.80 Million/uL   Hemoglobin 14.0 13.2 - 17.1 g/dL   HCT 41.0 38.5 - 50.0 %   MCV 88.2 80.0 - 100.0 fL   MCH 30.1 27.0 - 33.0 pg   MCHC 34.1 32.0 - 36.0 g/dL   RDW 12.2 11.0 - 15.0 %   Platelets 318 140 - 400 Thousand/uL   MPV 9.7 7.5 - 12.5 fL   Neutro Abs 3,913 1,500 - 7,800 cells/uL   Lymphs Abs 2,285 850 - 3,900 cells/uL   WBC mixed population 672 200 - 950 cells/uL   Eosinophils Absolute 402 15 - 500 cells/uL   Basophils Absolute 29 0 - 200 cells/uL   Neutrophils Relative % 53.6 %   Total Lymphocyte 31.3 %   Monocytes  Relative 9.2 %   Eosinophils Relative 5.5 %   Basophils Relative 0.4 %  CBC with Differential/Platelet     Status: Abnormal   Collection Time: 08/08/18  9:03 AM  Result Value Ref Range   WBC 4.8 4.0 - 10.5 K/uL   RBC 4.96 4.22 - 5.81 Mil/uL   Hemoglobin 15.1 13.0 - 17.0 g/dL   HCT 44.2 39.0 - 52.0 %   MCV 89.1 78.0 - 100.0 fl   MCHC 34.2 30.0 - 36.0 g/dL   RDW 13.1 11.5 - 15.5 %   Platelets 307.0 150.0 - 400.0 K/uL   Neutrophils Relative % 51.0 43.0 - 77.0 %   Lymphocytes Relative 33.0 12.0 - 46.0 %   Monocytes Relative 9.5 3.0 - 12.0 %   Eosinophils Relative 6.1 (H) 0.0 - 5.0 %   Basophils Relative 0.4 0.0 - 3.0 %   Neutro Abs 2.4 1.4 - 7.7 K/uL   Lymphs Abs 1.6 0.7 - 4.0 K/uL   Monocytes Absolute 0.5 0.1 - 1.0 K/uL   Eosinophils Absolute 0.3 0.0 - 0.7 K/uL   Basophils Absolute 0.0 0.0 - 0.1 K/uL  Comprehensive metabolic panel     Status: None   Collection Time: 08/08/18  9:03 AM  Result  Value Ref Range   Sodium 142 135 - 145 mEq/L   Potassium 4.7 3.5 - 5.1 mEq/L   Chloride 105 96 - 112 mEq/L   CO2 28 19 - 32 mEq/L   Glucose, Bld 84 70 - 99 mg/dL   BUN 14 6 - 23 mg/dL   Creatinine, Ser 0.99 0.40 - 1.50 mg/dL   Total Bilirubin 0.6 0.2 - 1.2 mg/dL   Alkaline Phosphatase 68 39 - 117 U/L   AST 24 0 - 37 U/L   ALT 29 0 - 53 U/L   Total Protein 6.7 6.0 - 8.3 g/dL   Albumin 4.4 3.5 - 5.2 g/dL   Calcium 9.8 8.4 - 10.5 mg/dL   GFR 80.94 >60.00 mL/min  PSA     Status: None   Collection Time: 08/08/18  9:03 AM  Result Value Ref Range   PSA 0.68 0.10 - 4.00 ng/mL    Comment: Test performed using Access Hybritech PSA Assay, a parmagnetic partical, chemiluminecent immunoassay.  Lipid panel     Status: Abnormal   Collection Time: 08/08/18  9:03 AM  Result Value Ref Range   Cholesterol 194 0 - 200 mg/dL    Comment: ATP III Classification       Desirable:  < 200 mg/dL               Borderline High:  200 - 239 mg/dL          High:  > = 240 mg/dL   Triglycerides 87.0 0.0 - 149.0  mg/dL    Comment: Normal:  <150 mg/dLBorderline High:  150 - 199 mg/dL   HDL 64.00 >39.00 mg/dL   VLDL 17.4 0.0 - 40.0 mg/dL   LDL Cholesterol 112 (H) 0 - 99 mg/dL   Total CHOL/HDL Ratio 3     Comment:                Men          Women1/2 Average Risk     3.4          3.3Average Risk          5.0          4.42X Average Risk          9.6          7.13X Average Risk          15.0          11.0                       NonHDL 129.72     Comment: NOTE:  Non-HDL goal should be 30 mg/dL higher than patient's LDL goal (i.e. LDL goal of < 70 mg/dL, would have non-HDL goal of < 100 mg/dL)  Hemoglobin A1c     Status: None   Collection Time: 08/08/18  9:03 AM  Result Value Ref Range   Hgb A1c MFr Bld 5.6 4.6 - 6.5 %    Comment: Glycemic Control Guidelines for People with Diabetes:Non Diabetic:  <6%Goal of Therapy: <7%Additional Action Suggested:  >8%     Assessment/Plan: Visit for preventive health examination Depression screen negative. Health Maintenance reviewed. Preventive schedule discussed and handout given in AVS. Will obtain fasting labs today.   Prostate cancer screening The natural history of prostate cancer and ongoing controversy regarding screening and potential treatment outcomes of prostate cancer has been discussed with the patient. The meaning of a false positive PSA and a false  negative PSA has been discussed. He indicates understanding of the limitations of this screening test and wishes to proceed with screening PSA testing. DRE performed with chaperone present. No palpable nodules or hypertrophy noted on exam.   Hyperlipidemia Taking medications as directed. Repeat fasting labs today. Dietary and exercise recommendations reviewed with patient.   Internal hemorrhoid Start hydrocortisone suppository BID x 6 days. Supportive measures reviewed. If not resolving, will needed colorectal specialist.     Leeanne Rio, PA-C

## 2018-08-08 NOTE — Progress Notes (Deleted)
Established Patient Office Visit  Subjective:  Patient ID: Jared Coffey, male    DOB: 12-Feb-1956  Age: 63 y.o. MRN: [id number]  CC:  Chief Complaint  Patient presents with  . Annual Exam    HPI [name] presents for *** States that he is having    Past Medical History:  Diagnosis Date  . GERD (gastroesophageal reflux disease)   . H/O necrotizing fasciitis 2009   right upper thigh  . Hyperlipidemia    lower with diet changes  . Wears glasses   . Wears partial dentures     Past Surgical History:  Procedure Laterality Date  . COLONOSCOPY     declines, will refer for Cologuard 09/2015  . LEG SURGERY  2009   right upper leg,necrotizing fascitis  . NASAL SINUS SURGERY     age 52yo    Family History  Problem Relation Age of Onset  . Stroke Mother   . Dementia Mother        vascular  . Cancer Father 50       died of bladder cancer  . Osteoporosis Sister   . Diabetes Neg Hx   . Heart disease Neg Hx   . Hypertension Neg Hx   . Colon cancer Neg Hx     Social History   Socioeconomic History  . Marital status: Married    Spouse name: Not on file  . Number of children: Not on file  . Years of education: Not on file  . Highest education level: Not on file  Occupational History  . Not on file  Social Needs  . Financial resource strain: Not on file  . Food insecurity:    Worry: Not on file    Inability: Not on file  . Transportation needs:    Medical: Not on file    Non-medical: Not on file  Tobacco Use  . Smoking status: Former Smoker    Packs/day: 0.50    Years: 15.00    Pack years: 7.50    Start date: 06/20/2004  . Smokeless tobacco: Never Used  Substance and Sexual Activity  . Alcohol use: No    Alcohol/week: 0.0 standard drinks  . Drug use: No  . Sexual activity: Not Currently  Lifestyle  . Physical activity:    Days per week: Not on file    Minutes per session: Not on file  . Stress: Not on file  Relationships  . Social  connections:    Talks on phone: Not on file    Gets together: Not on file    Attends religious service: Not on file    Active member of club or organization: Not on file    Attends meetings of clubs or organizations: Not on file    Relationship status: Not on file  . Intimate partner violence:    Fear of current or ex partner: Not on file    Emotionally abused: Not on file    Physically abused: Not on file    Forced sexual activity: Not on file  Other Topics Concern  . Not on file  Social History Narrative   Married, 2 children from ex wife, exercise - 5 days per week, 40-50 minutes, kung fu, weight training.  Walk nightly.   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 nystatin-triamcinolone ointment (MYCOLOG) Apply externally to the affected area twice daily.  60 g 0  . omeprazole (PRILOSEC) 10 MG capsule Take 1 capsule (10 mg total) by mouth daily. 30 capsule 11  . pravastatin (PRAVACHOL) 20 MG tablet TAKE 1 TABLET BY MOUTH EVERY DAY 90 tablet 1  . nystatin (MYCOSTATIN/NYSTOP) powder Apply topically 3 (three) times daily. 60 g 0   No facility-administered medications prior to visit.     Allergies  Allergen Reactions  . Penicillins Rash    ROS Review of Systems  Constitutional: Negative.   HENT: Positive for tinnitus (from time to time ).   Eyes: Negative.   Respiratory: Negative.   Cardiovascular: Negative.   Gastrointestinal: Negative.   Genitourinary: Negative.   Musculoskeletal: Positive for back pain (pt attributes to sitting too long at work).  Neurological: Negative.   Psychiatric/Behavioral: Negative.       Objective:    Physical Exam  Constitutional: He is oriented to person, place, and time. He appears well-developed and well-nourished.  HENT:  Head: Normocephalic.  Eyes: Pupils are equal, round, and reactive to light. Conjunctivae are normal.    Cardiovascular: Normal rate, regular rhythm and normal heart sounds.  Pulmonary/Chest: Effort normal and breath sounds normal.  Abdominal: Soft. Bowel sounds are normal.  Neurological: He is alert and oriented to person, place, and time.  Skin: Skin is warm and dry.    BP 130/80   Pulse 67   Temp 98 F (36.7 C) (Oral)   Resp 14   Ht 5\' 7"  (1.702 m)   Wt 81.2 kg   SpO2 99%   BMI 28.04 kg/m  Wt Readings from Last 3 Encounters:  08/08/18 81.2 kg  05/29/18 79.4 kg  11/21/17 79.8 kg     There are no preventive care reminders to display for this patient.  There are no preventive care reminders to display for this patient.  Lab Results  Component Value Date   TSH 1.80 09/04/2016   Lab Results  Component Value Date   WBC 7.3 05/29/2018   HGB 14.0 05/29/2018   HCT 41.0 05/29/2018   MCV 88.2 05/29/2018   PLT 318 05/29/2018   Lab Results  Component Value Date   NA 142 05/29/2018   K 4.4 05/29/2018   CO2 28 05/29/2018   GLUCOSE 88 05/29/2018   BUN 16 05/29/2018   CREATININE 1.01 05/29/2018   BILITOT 0.5 05/29/2018   ALKPHOS 61 11/21/2017   AST 20 05/29/2018   ALT 17 05/29/2018   PROT 6.4 05/29/2018   ALBUMIN 4.3 11/21/2017   CALCIUM 9.5 05/29/2018   GFR 81.12 11/21/2017   Lab Results  Component Value Date   CHOL 184 11/21/2017   Lab Results  Component Value Date   HDL 58.00 11/21/2017   Lab Results  Component Value Date   LDLCALC 110 (H) 11/21/2017   Lab Results  Component Value Date   TRIG 80.0 11/21/2017   Lab Results  Component Value Date   CHOLHDL 3 11/21/2017   Lab Results  Component Value Date   HGBA1C 5.5 11/21/2017      Assessment & Plan:   Problem List Items Addressed This Visit    None      No orders of the defined types were placed in this encounter.   Follow-up: No follow-ups on file.    Gerrianne Aydelott E Tymar Polyak, Student-PA

## 2018-08-10 DIAGNOSIS — K648 Other hemorrhoids: Secondary | ICD-10-CM | POA: Insufficient documentation

## 2018-08-10 MED ORDER — HYDROCORTISONE ACETATE 25 MG RE SUPP: 25.0000 mg | Freq: Two times a day (BID) | RECTAL | 0 refills | Status: DC

## 2018-08-10 NOTE — Assessment & Plan Note (Signed)
Taking medications as directed. Repeat fasting labs today. Dietary and exercise recommendations reviewed with patient.

## 2018-08-10 NOTE — Assessment & Plan Note (Signed)
Depression screen negative. Health Maintenance reviewed. Preventive schedule discussed and handout given in AVS. Will obtain fasting labs today.  

## 2018-08-10 NOTE — Assessment & Plan Note (Signed)
Start hydrocortisone suppository BID x 6 days. Supportive measures reviewed. If not resolving, will needed colorectal specialist.

## 2018-08-10 NOTE — Assessment & Plan Note (Signed)
The natural history of prostate cancer and ongoing controversy regarding screening and potential treatment outcomes of prostate cancer has been discussed with the patient. The meaning of a false positive PSA and a false negative PSA has been discussed. He indicates understanding of the limitations of this screening test and wishes to proceed with screening PSA testing. DRE performed with chaperone present. No palpable nodules or hypertrophy noted on exam.

## 2018-11-17 ENCOUNTER — Telehealth: Payer: Self-pay | Admitting: Physician Assistant

## 2018-11-17 DIAGNOSIS — E785 Hyperlipidemia, unspecified: Secondary | ICD-10-CM

## 2018-11-17 MED ORDER — PRAVASTATIN SODIUM 20 MG PO TABS: 20.0000 mg | ORAL_TABLET | Freq: Every day | ORAL | 1 refills | Status: DC

## 2018-11-17 NOTE — Telephone Encounter (Signed)
Per the [facility] call center pt needs a refill on his cholesterol medication. Pt states that he will be out of the medication Monday. Call back # is [phone].

## 2019-01-26 IMAGING — DX DG ELBOW COMPLETE 3+V*L*
4 series · 4 of 4 positions shown · non-contrast
Comparison: None.

CLINICAL DATA: Fall from ladder with left elbow pain, initial
encounter

EXAM:
LEFT ELBOW - COMPLETE 3+ VIEW

[elbow ap]
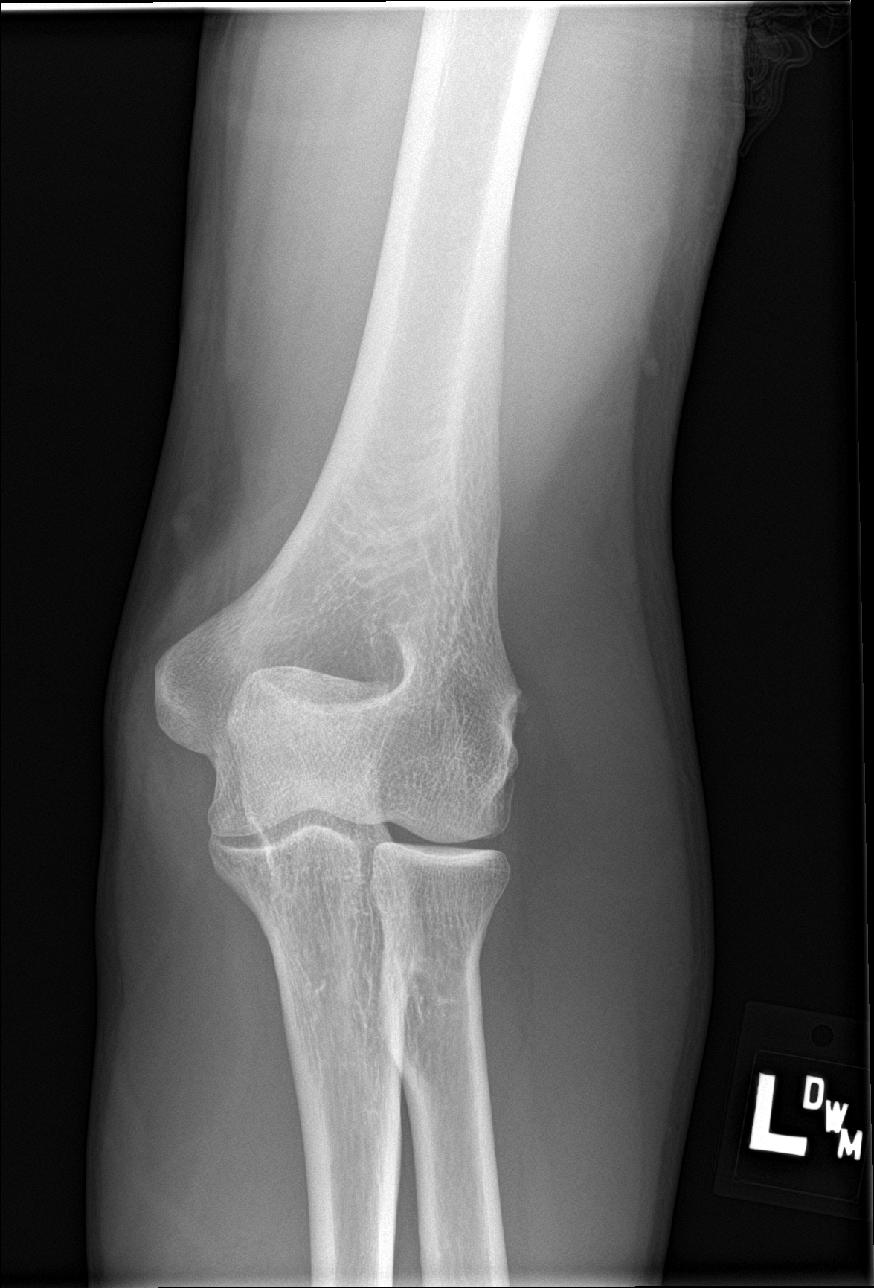

[elbow obl (1 of 2)]
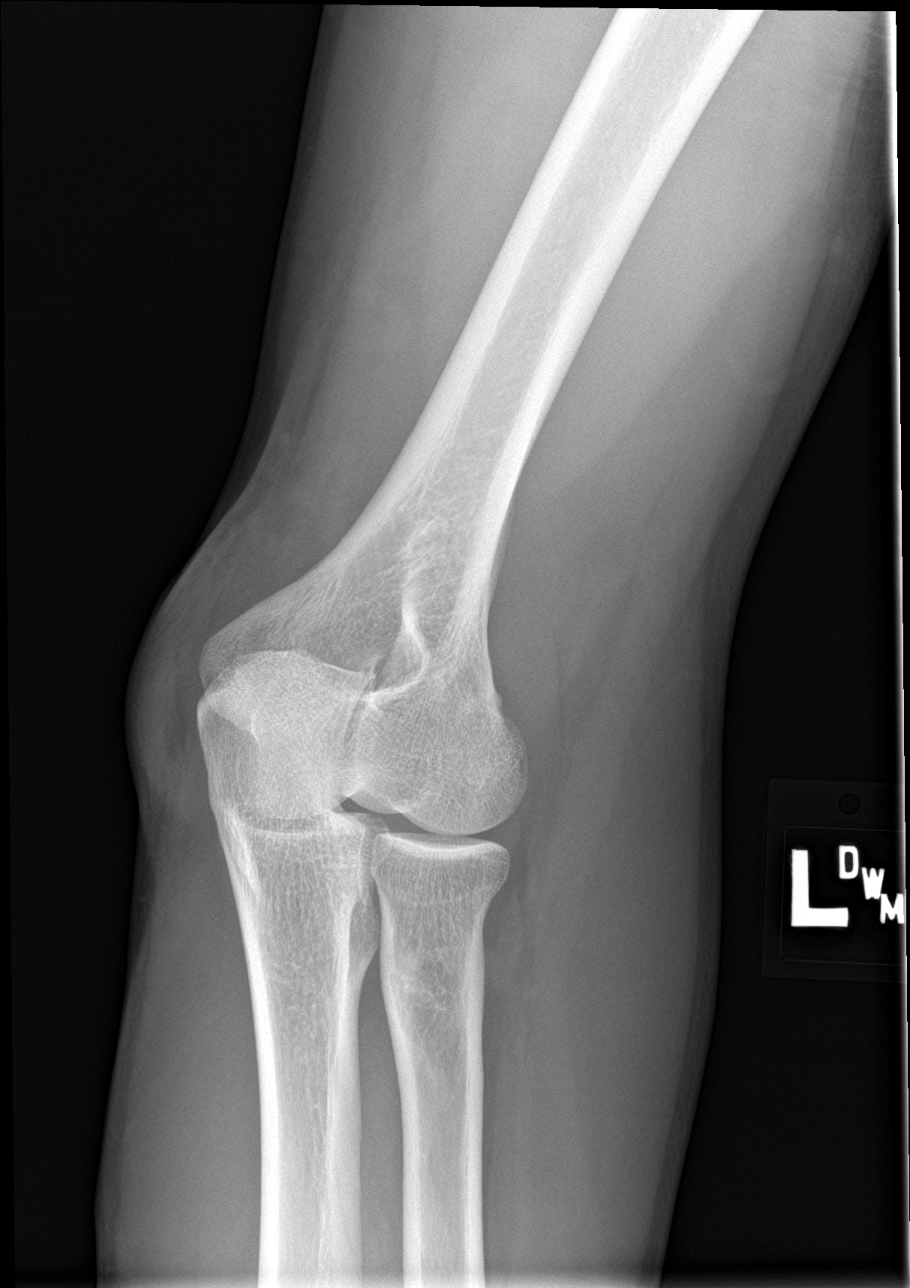

[elbow obl (2 of 2)]
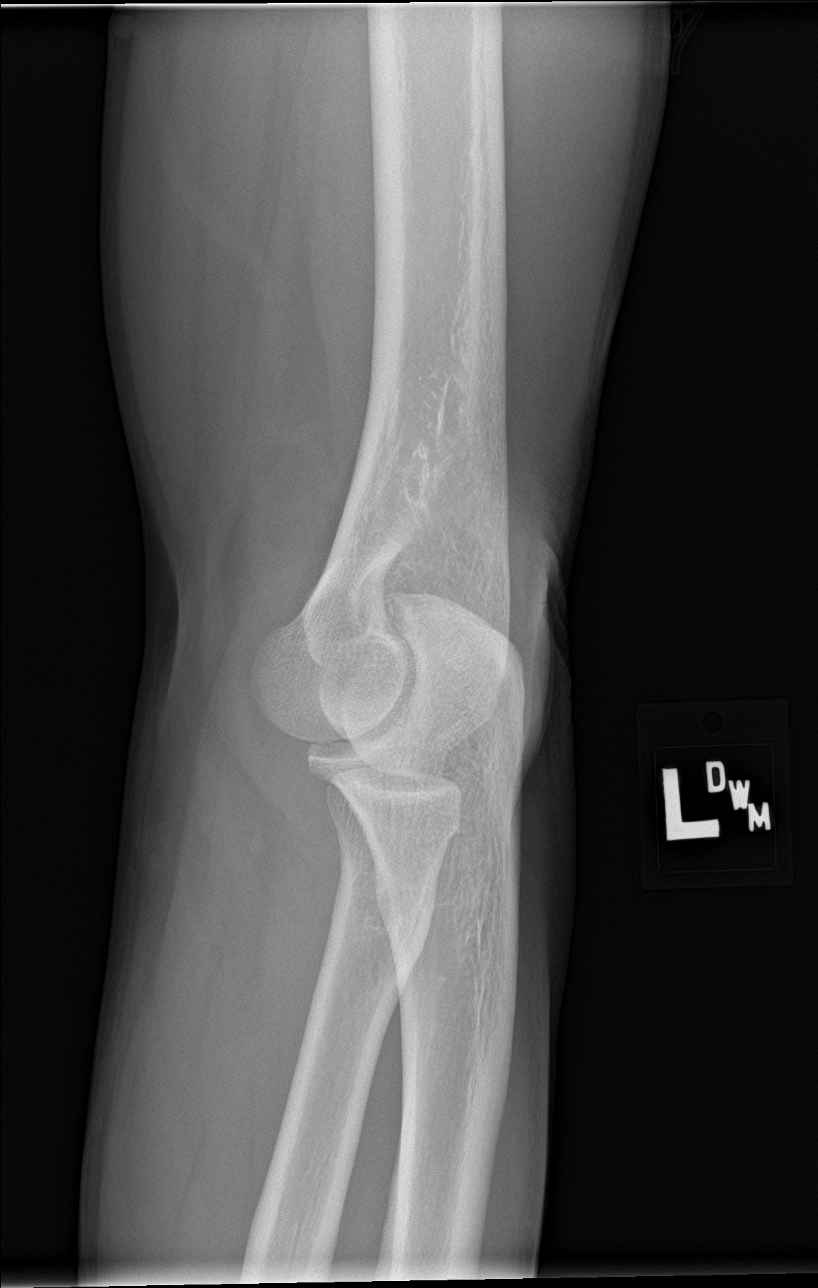

[elbow lat]
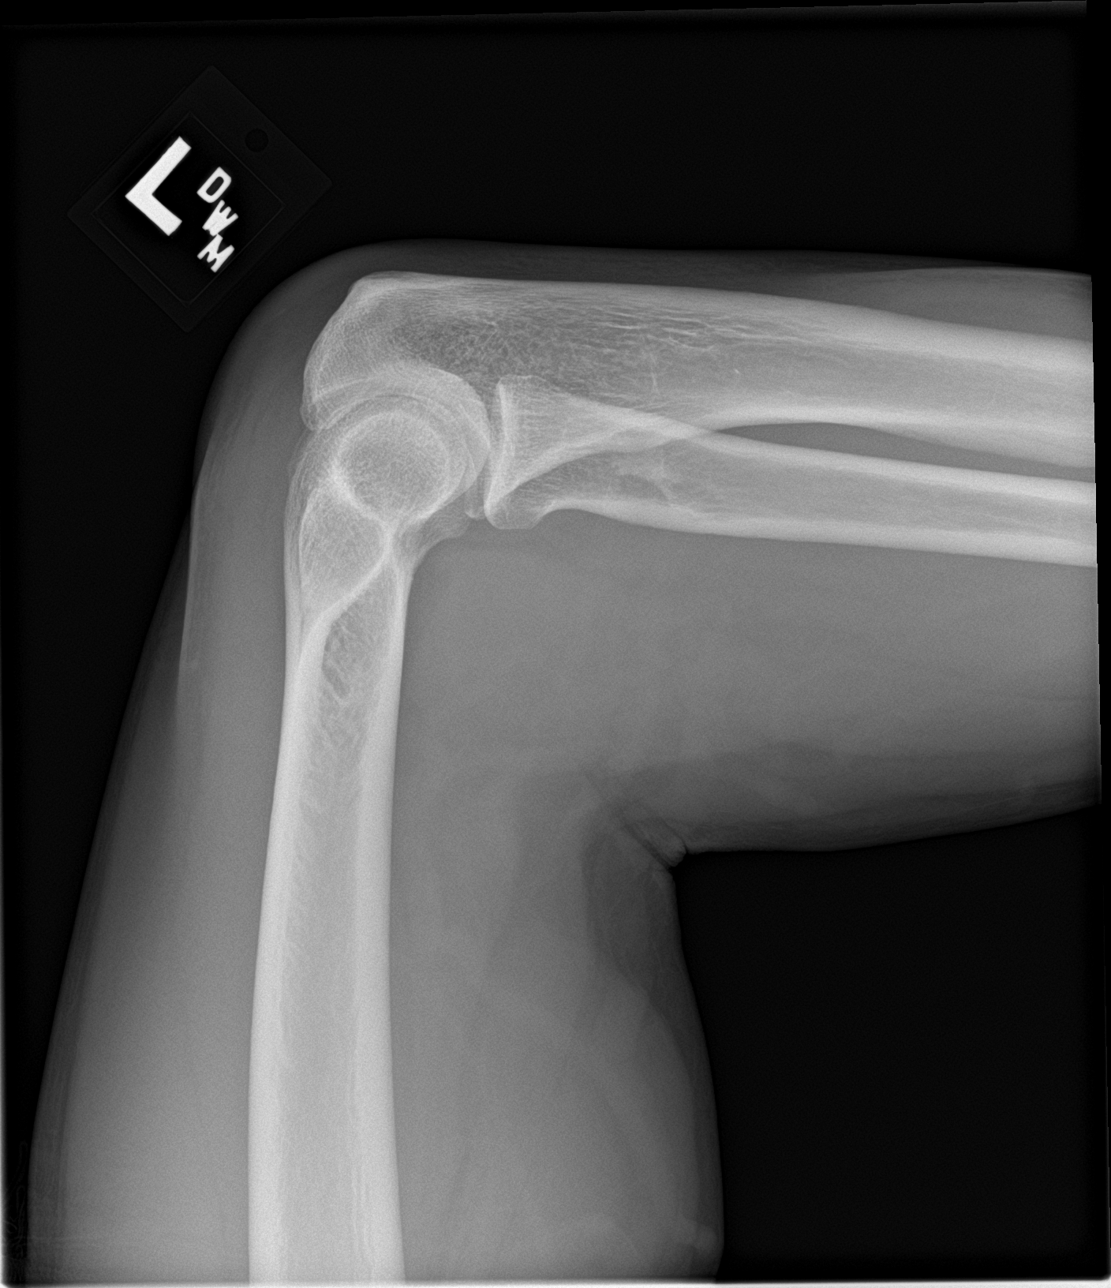

[4 of 4 positions shown; findings below may reference images not displayed]

FINDINGS: No acute fracture or dislocation is noted. Mild soft tissue swelling
is noted over the olecranon consistent with the recent injury and
history.
IMPRESSION: Mild soft tissue swelling without acute bony abnormality.

## 2019-02-13 ENCOUNTER — Other Ambulatory Visit: Payer: Self-pay

## 2019-02-13 ENCOUNTER — Telehealth: Payer: Self-pay | Admitting: Physician Assistant

## 2019-02-13 MED ORDER — OMEPRAZOLE 10 MG PO CPDR: 10.0000 mg | DELAYED_RELEASE_CAPSULE | Freq: Every day | ORAL | 1 refills | Status: DC

## 2019-02-13 NOTE — Telephone Encounter (Signed)
Medication: omeprazole (PRILOSEC) 10 MG capsule [[id number]  Has the patient contacted their pharmacy? Yes  (Agent: If no, request that the patient contact the pharmacy for the refill.) (Agent: If yes, when and what did the pharmacy advise?)  Preferred Pharmacy (with phone number or street name): [facility], [place] - [address] [address] [phone] (Phone) [phone] (Fax)    Agent: Please be advised that RX refills may take up to 3 business days. We ask that you follow-up with your pharmacy.

## 2019-03-27 ENCOUNTER — Telehealth: Payer: Self-pay

## 2019-03-27 NOTE — Telephone Encounter (Signed)
LMOVM to schedule appt for Monday

## 2019-03-27 NOTE — Telephone Encounter (Signed)
If passing screening and no other symptoms ok to work in this afternoon for exam of ears and cleaning. Please let him know that if there are other concerns, a separate appointment will need to be scheduled for this. Thank you.

## 2019-03-27 NOTE — Telephone Encounter (Signed)
Patient called in c/o wax build up in both ears, effecting hearing. Wanting to know if he can come in office or be sent to ENT. Please advise

## 2019-05-11 ENCOUNTER — Ambulatory Visit: Payer: No Typology Code available for payment source | Admitting: Physician Assistant

## 2019-05-11 ENCOUNTER — Encounter: Payer: Self-pay | Admitting: Physician Assistant

## 2019-05-11 ENCOUNTER — Other Ambulatory Visit: Payer: Self-pay

## 2019-05-11 VITALS — BP 120/60 | HR 65 | Temp 98.4°F | Resp 16 | Ht 67.0 in | Wt 179.0 lb

## 2019-05-11 DIAGNOSIS — B354 Tinea corporis: Secondary | ICD-10-CM | POA: Diagnosis not present

## 2019-05-11 DIAGNOSIS — H6123 Impacted cerumen, bilateral: Secondary | ICD-10-CM | POA: Diagnosis not present

## 2019-05-11 MED ORDER — CLOTRIMAZOLE-BETAMETHASONE 1-0.05 % EX CREA: 1.0000 "application " | TOPICAL_CREAM | Freq: Two times a day (BID) | CUTANEOUS | 0 refills | Status: DC

## 2019-05-11 NOTE — Patient Instructions (Signed)
Please keep skin clean and dry. Apply topical Witch Hazel or Sea Breeze astringent to help cool and sooth itching.   I have sent in a prescription for you for Lotrisone cream to apply twice daily to the area for 14 days. If not resolving, or anything worsens, please let us know.    Earwax Buildup, Adult The ears produce a substance called earwax that helps keep bacteria out of the ear and protects the skin in the ear canal. Occasionally, earwax can build up in the ear and cause discomfort or hearing loss. What increases the risk? This condition is more likely to develop in people who:  Are male.  Are elderly.  Naturally produce more earwax.  Clean their ears often with cotton swabs.  Use earplugs often.  Use in-ear headphones often.  Wear hearing aids.  Have narrow ear canals.  Have earwax that is overly thick or sticky.  Have eczema.  Are dehydrated.  Have excess hair in the ear canal. What are the signs or symptoms? Symptoms of this condition include:  Reduced or muffled hearing.  A feeling of fullness in the ear or feeling that the ear is plugged.  Fluid coming from the ear.  Ear pain.  Ear itch.  Ringing in the ear.  Coughing.  An obvious piece of earwax that can be seen inside the ear canal. How is this diagnosed? This condition may be diagnosed based on:  Your symptoms.  Your medical history.  An ear exam. During the exam, your health care provider will look into your ear with an instrument called an otoscope. You may have tests, including a hearing test. How is this treated? This condition may be treated by:  Using ear drops to soften the earwax.  Having the earwax removed by a health care provider. The health care provider may: ? Flush the ear with water. ? Use an instrument that has a loop on the end (curette). ? Use a suction device.  Surgery to remove the wax buildup. This may be done in severe cases. Follow these instructions at home:    Take over-the-counter and prescription medicines only as told by your health care provider.  Do not put any objects, including cotton swabs, into your ear. You can clean the opening of your ear canal with a washcloth or facial tissue.  Follow instructions from your health care provider about cleaning your ears. Do not over-clean your ears.  Drink enough fluid to keep your urine clear or pale yellow. This will help to thin the earwax.  Keep all follow-up visits as told by your health care provider. If earwax builds up in your ears often or if you use hearing aids, consider seeing your health care provider for routine, preventive ear cleanings. Ask your health care provider how often you should schedule your cleanings.  If you have hearing aids, clean them according to instructions from the manufacturer and your health care provider. Contact a health care provider if:  You have ear pain.  You develop a fever.  You have blood, pus, or other fluid coming from your ear.  You have hearing loss.  You have ringing in your ears that does not go away.  Your symptoms do not improve with treatment.  You feel like the room is spinning (vertigo). Summary  Earwax can build up in the ear and cause discomfort or hearing loss.  The most common symptoms of this condition include reduced or muffled hearing and a feeling of fullness in  the ear or feeling that the ear is plugged.  This condition may be diagnosed based on your symptoms, your medical history, and an ear exam.  This condition may be treated by using ear drops to soften the earwax or by having the earwax removed by a health care provider.  Do not put any objects, including cotton swabs, into your ear. You can clean the opening of your ear canal with a washcloth or facial tissue. This information is not intended to replace advice given to you by your health care provider. Make sure you discuss any questions you have with your health  care provider. Document Released: 08/23/2004 Document Revised: 06/28/2017 Document Reviewed: 09/26/2016 Elsevier Patient Education  2020 [employer]. .

## 2019-05-11 NOTE — Progress Notes (Signed)
Patient presents to clinic today c/o a rash of posterior R leg x 10 days starting as two little red dots. Thought initially was an insect bite. Denies pain, tenderness at the area. Notes significant itching. Notes area has increased considerably in size since appearance. Denies similar areas elsewhere. Denies change to soaps, lotions or detergents.   Patient also would like ear wax removal today. Has history of ear wax impaction. Noting fullness in the ear canals with some decreased hearing. Denies ear pain, tinnitus or drainage..   Past Medical History:  Diagnosis Date  . GERD (gastroesophageal reflux disease)   . H/O necrotizing fasciitis 2009   right upper thigh  . Hyperlipidemia    lower with diet changes  . Wears glasses   . Wears partial dentures     Current Outpatient Medications on File Prior to Visit  Medication Sig Dispense Refill  . hydrocortisone (ANUSOL-HC) 25 MG suppository Place 1 suppository (25 mg total) rectally 2 (two) times daily. 12 suppository 0  . nystatin-triamcinolone ointment (MYCOLOG) Apply externally to the affected area twice daily. 60 g 0  . pravastatin (PRAVACHOL) 20 MG tablet Take 1 tablet (20 mg total) by mouth daily. 90 tablet 1   No current facility-administered medications on file prior to visit.     Allergies  Allergen Reactions  . Penicillins Rash    Family History  Problem Relation Age of Onset  . Stroke Mother   . Dementia Mother        vascular  . Cancer Father 23       died of bladder cancer  . Osteoporosis Sister   . Diabetes Neg Hx   . Heart disease Neg Hx   . Hypertension Neg Hx   . Colon cancer Neg Hx     Social History   Socioeconomic History  . Marital status: Married    Spouse name: Not on file  . Number of children: Not on file  . Years of education: Not on file  . Highest education level: Not on file  Occupational History  . Not on file  Social Needs  . Financial resource strain: Not on file  . Food  insecurity    Worry: Not on file    Inability: Not on file  . Transportation needs    Medical: Not on file    Non-medical: Not on file  Tobacco Use  . Smoking status: Former Smoker    Packs/day: 0.50    Years: 15.00    Pack years: 7.50    Start date: 06/20/2004  . Smokeless tobacco: Never Used  Substance and Sexual Activity  . Alcohol use: No    Alcohol/week: 0.0 standard drinks  . Drug use: No  . Sexual activity: Not Currently  Lifestyle  . Physical activity    Days per week: Not on file    Minutes per session: Not on file  . Stress: Not on file  Relationships  . Social Herbalist on phone: Not on file    Gets together: Not on file    Attends religious service: Not on file    Active member of club or organization: Not on file    Attends meetings of clubs or organizations: Not on file    Relationship status: Not on file  Other Topics Concern  . Not on file  Social History Narrative   Married, 2 children from ex wife, exercise - 5 days per week, 40-50 minutes, kung fu, weight training.  Walk nightly.   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  All other ROS are negative.  Temp 98.4 F (36.9 C) (Temporal)   Resp 16   Ht 5\' 7"  (1.702 m)   Wt 179 lb (81.2 kg)   BMI 28.04 kg/m   Physical Exam Vitals signs reviewed.  Constitutional:      Appearance: Normal appearance.  HENT:     Head: Normocephalic and atraumatic.     Comments: Cerumen impaction noted bilaterally Neck:     Musculoskeletal: Neck supple.  Cardiovascular:     Rate and Rhythm: Normal rate and regular rhythm.     Heart sounds: Normal heart sounds.  Pulmonary:     Effort: Pulmonary effort is normal.  Skin:      Neurological:     Mental Status: He is alert.  Psychiatric:        Mood and Affect: Mood normal.    Assessment/Plan: 1. Tinea corporis Rx Clotrimazole-betamethasone BID x 14 days. Topical Witch Hazel to the  area to help soothe. Other supportive measures discussed. Follow-up if not resolving.   2. Bilateral impacted cerumen Unable to remove manually. Irrigation x 2 performed today. Successful and tolerated well without complication. Home measures reviewed.    , PA-C

## 2019-05-12 ENCOUNTER — Other Ambulatory Visit: Payer: Self-pay | Admitting: Physician Assistant

## 2019-05-12 DIAGNOSIS — E785 Hyperlipidemia, unspecified: Secondary | ICD-10-CM

## 2019-06-22 ENCOUNTER — Other Ambulatory Visit: Payer: Self-pay

## 2019-06-22 ENCOUNTER — Encounter: Payer: Self-pay | Admitting: Physician Assistant

## 2019-06-22 ENCOUNTER — Ambulatory Visit (INDEPENDENT_AMBULATORY_CARE_PROVIDER_SITE_OTHER): Payer: No Typology Code available for payment source | Admitting: Physician Assistant

## 2019-06-22 VITALS — BP 124/80 | HR 72 | Temp 98.4°F | Resp 14 | Ht 67.0 in | Wt 182.0 lb

## 2019-06-22 DIAGNOSIS — S6981XA Other specified injuries of right wrist, hand and finger(s), initial encounter: Secondary | ICD-10-CM

## 2019-06-22 NOTE — Patient Instructions (Signed)
Please keep the finger splinted over the next week. Elevate extremity while resting. Alternate ice and heat to the area twice daily for 10 minutes each. Tylenol if needed for pain.  If not continuing to improve, we would need imaging or for you to at least let us have you see a hand specialist.

## 2019-06-22 NOTE — Progress Notes (Signed)
Patient presents to clinic today c/o pain in right MCP joint after a hyperextension of the phalanx while cutting down trees. Denies any crushing, pulling or jamming of finger. Initially noted swelling and some decreased ROM 2/2 pain. Denies numbness, tingling, bruising. Now with complete ROM but still having occasional pain in the joint. Has not taking anything for symptoms. Pain about 3/10 when present.   Past Medical History:  Diagnosis Date  . GERD (gastroesophageal reflux disease)   . H/O necrotizing fasciitis 2009   right upper thigh  . Hyperlipidemia    lower with diet changes  . Wears glasses   . Wears partial dentures     Current Outpatient Medications on File Prior to Visit  Medication Sig Dispense Refill  . clotrimazole-betamethasone (LOTRISONE) cream Apply 1 application topically 2 (two) times daily. For 2 weeks. 30 g 0  . hydrocortisone (ANUSOL-HC) 25 MG suppository Place 1 suppository (25 mg total) rectally 2 (two) times daily. 12 suppository 0  . nystatin-triamcinolone ointment (MYCOLOG) Apply externally to the affected area twice daily. 60 g 0  . pravastatin (PRAVACHOL) 20 MG tablet TAKE 1 TABLET(20 MG) BY MOUTH DAILY 90 tablet 1   No current facility-administered medications on file prior to visit.     Allergies  Allergen Reactions  . Penicillins Rash    Family History  Problem Relation Age of Onset  . Stroke Mother   . Dementia Mother        vascular  . Cancer Father 23       died of bladder cancer  . Osteoporosis Sister   . Diabetes Neg Hx   . Heart disease Neg Hx   . Hypertension Neg Hx   . Colon cancer Neg Hx     Social History   Socioeconomic History  . Marital status: Married    Spouse name: Not on file  . Number of children: Not on file  . Years of education: Not on file  . Highest education level: Not on file  Occupational History  . Not on file  Social Needs  . Financial resource strain: Not on file  . Food insecurity    Worry: Not  on file    Inability: Not on file  . Transportation needs    Medical: Not on file    Non-medical: Not on file  Tobacco Use  . Smoking status: Former Smoker    Packs/day: 0.50    Years: 15.00    Pack years: 7.50    Start date: 06/20/2004  . Smokeless tobacco: Never Used  Substance and Sexual Activity  . Alcohol use: No    Alcohol/week: 0.0 standard drinks  . Drug use: No  . Sexual activity: Not Currently  Lifestyle  . Physical activity    Days per week: Not on file    Minutes per session: Not on file  . Stress: Not on file  Relationships  . Social Musician on phone: Not on file    Gets together: Not on file    Attends religious service: Not on file    Active member of club or organization: Not on file    Attends meetings of clubs or organizations: Not on file    Relationship status: Not on file  Other Topics Concern  . Not on file  Social History Narrative   Married, 2 children from ex wife, exercise - 5 days per week, 40-50 minutes, kung fu, weight training.  Walk nightly.   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  All other ROS are negative.  Wt 182 lb (82.6 kg)   BMI 28.51 kg/m   Physical Exam Vitals signs reviewed.  Constitutional:      Appearance: Normal appearance.  HENT:     Head: Normocephalic and atraumatic.  Neck:     Musculoskeletal: Neck supple.  Cardiovascular:     Rate and Rhythm: Normal rate and regular rhythm.     Heart sounds: Normal heart sounds.  Pulmonary:     Effort: Pulmonary effort is normal.  Musculoskeletal:       Hands:  Neurological:     Mental Status: He is alert.  Psychiatric:        Mood and Affect: Mood normal.    Assessment/Plan: 1. Hyperextension injury of finger, right, initial encounter Normal ROM. Extremity is neurovascularly intact. No noted bony deformity. Giving pain with palpation of MCP joint it is reasonable to get images but patient  declines. Splint applied in office to help immobilize joints in question and to promote healing. OTC medications and supportive measures reviewed. If not improving we will need to have him see Sports Med.     Leeanne Rio, PA-C

## 2019-09-30 ENCOUNTER — Telehealth: Payer: Self-pay | Admitting: Physician Assistant

## 2019-09-30 ENCOUNTER — Encounter: Payer: Self-pay | Admitting: Physician Assistant

## 2019-09-30 ENCOUNTER — Other Ambulatory Visit: Payer: Self-pay

## 2019-09-30 ENCOUNTER — Ambulatory Visit (INDEPENDENT_AMBULATORY_CARE_PROVIDER_SITE_OTHER): Payer: Medicare Other | Admitting: Physician Assistant

## 2019-09-30 VITALS — BP 122/78 | HR 60 | Temp 98.7°F | Resp 16 | Ht 67.0 in | Wt 184.0 lb

## 2019-09-30 DIAGNOSIS — Z0000 Encounter for general adult medical examination without abnormal findings: Secondary | ICD-10-CM | POA: Diagnosis not present

## 2019-09-30 DIAGNOSIS — M5431 Sciatica, right side: Secondary | ICD-10-CM | POA: Diagnosis not present

## 2019-09-30 DIAGNOSIS — E785 Hyperlipidemia, unspecified: Secondary | ICD-10-CM | POA: Diagnosis not present

## 2019-09-30 DIAGNOSIS — Z125 Encounter for screening for malignant neoplasm of prostate: Secondary | ICD-10-CM

## 2019-09-30 DIAGNOSIS — L03012 Cellulitis of left finger: Secondary | ICD-10-CM

## 2019-09-30 DIAGNOSIS — Z1211 Encounter for screening for malignant neoplasm of colon: Secondary | ICD-10-CM | POA: Diagnosis not present

## 2019-09-30 LAB — CBC WITH DIFFERENTIAL/PLATELET
Basophils Absolute: 0 10*3/uL (ref 0.0–0.1)
Basophils Relative: 0.5 % (ref 0.0–3.0)
Eosinophils Absolute: 0.4 10*3/uL (ref 0.0–0.7)
Eosinophils Relative: 6.5 % — ABNORMAL HIGH (ref 0.0–5.0)
HCT: 41.5 % (ref 39.0–52.0)
Hemoglobin: 14.4 g/dL (ref 13.0–17.0)
Lymphocytes Relative: 29.2 % (ref 12.0–46.0)
Lymphs Abs: 1.7 10*3/uL (ref 0.7–4.0)
MCHC: 34.8 g/dL (ref 30.0–36.0)
MCV: 88.8 fl (ref 78.0–100.0)
Monocytes Absolute: 0.6 10*3/uL (ref 0.1–1.0)
Monocytes Relative: 10.1 % (ref 3.0–12.0)
Neutro Abs: 3.2 10*3/uL (ref 1.4–7.7)
Neutrophils Relative %: 53.7 % (ref 43.0–77.0)
Platelets: 294 10*3/uL (ref 150.0–400.0)
RBC: 4.68 Mil/uL (ref 4.22–5.81)
RDW: 12.7 % (ref 11.5–15.5)
WBC: 6 10*3/uL (ref 4.0–10.5)

## 2019-09-30 LAB — COMPREHENSIVE METABOLIC PANEL
ALT: 22 U/L (ref 0–53)
AST: 22 U/L (ref 0–37)
Albumin: 4.2 g/dL (ref 3.5–5.2)
Alkaline Phosphatase: 65 U/L (ref 39–117)
BUN: 19 mg/dL (ref 6–23)
CO2: 28 mEq/L (ref 19–32)
Calcium: 9.5 mg/dL (ref 8.4–10.5)
Chloride: 104 mEq/L (ref 96–112)
Creatinine, Ser: 1.02 mg/dL (ref 0.40–1.50)
GFR: 73.31 mL/min (ref >60.00)
Glucose, Bld: 85 mg/dL (ref 70–99)
Potassium: 4.5 mEq/L (ref 3.5–5.1)
Sodium: 139 mEq/L (ref 135–145)
Total Bilirubin: 0.7 mg/dL (ref 0.2–1.2)
Total Protein: 6.6 g/dL (ref 6.0–8.3)

## 2019-09-30 LAB — LIPID PANEL
Cholesterol: 180 mg/dL (ref 0–200)
HDL: 53.2 mg/dL (ref >39.00)
LDL Cholesterol: 106 mg/dL — ABNORMAL HIGH (ref 0–99)
NonHDL: 126.92
Total CHOL/HDL Ratio: 3
Triglycerides: 103 mg/dL (ref 0.0–149.0)
VLDL: 20.6 mg/dL (ref 0.0–40.0)

## 2019-09-30 LAB — PSA: PSA: 0.55 ng/mL (ref 0.10–4.00)

## 2019-09-30 LAB — HEMOGLOBIN A1C: Hgb A1c MFr Bld: 5.4 % (ref 4.6–6.5)

## 2019-09-30 MED ORDER — MUPIROCIN CALCIUM 2 % EX CREA: TOPICAL_CREAM | CUTANEOUS | 0 refills | Status: DC

## 2019-09-30 NOTE — Telephone Encounter (Signed)
Pt called in stating that the inflammatory medication was not sent in to the [facility]. He also states that the cream that was sent in today was $150.00, he wanted to now if something could be sent in for in place of that and also he would like the cream that was sent in in Oct. Sent in again. Please advise

## 2019-09-30 NOTE — Telephone Encounter (Signed)
Anti-inflammatory was a daily Aleve which is OTC.  Can we check with pharmacy to see if they cost of Mupirocin is due to formulation and if we can change that? If not would recommend topical neosporin or bacitracin to the area twice daily for the next week.   Please verify what other cream he is referring to.

## 2019-09-30 NOTE — Patient Instructions (Signed)
Please go to the lab for blood work.   Our office will call you with your results unless you have chosen to receive results via MyChart.  If your blood work is normal we will follow-up each year for physicals and as scheduled for chronic medical problems.  If anything is abnormal we will treat accordingly and get you in for a follow-up.  Apply the Mupirocin cream as directed. Keep skin clean and dry.  Area should continue to heal.  For the back -- try the stretches below. Take a daily Aleve each morning over the next 4-5 days. Let us know if things are still reoccurring.    Sciatica Rehab Ask your health care provider which exercises are safe for you. Do exercises exactly as told by your health care provider and adjust them as directed. It is normal to feel mild stretching, pulling, tightness, or discomfort as you do these exercises. Stop right away if you feel sudden pain or your pain gets worse. Do not begin these exercises until told by your health care provider. Stretching and range-of-motion exercises These exercises warm up your muscles and joints and improve the movement and flexibility of your hips and back. These exercises also help to relieve pain, numbness, and tingling. Sciatic nerve glide 1. Sit in a chair with your head facing down toward your chest. Place your hands behind your back. Let your shoulders slump forward. 2. Slowly straighten one of your legs while you tilt your head back as if you are looking toward the ceiling. Only straighten your leg as far as you can without making your symptoms worse. 3. Hold this position for __________ seconds. 4. Slowly return to the starting position. 5. Repeat with your other leg. Repeat __________ times. Complete this exercise __________ times a day. Knee to chest with hip adduction and internal rotation  1. Lie on your back on a firm surface with both legs straight. 2. Bend one of your knees and move it up toward your chest until  you feel a gentle stretch in your lower back and buttock. Then, move your knee toward the shoulder that is on the opposite side from your leg. This is hip adduction and internal rotation. ? Hold your leg in this position by holding on to the front of your knee. 3. Hold this position for __________ seconds. 4. Slowly return to the starting position. 5. Repeat with your other leg. Repeat __________ times. Complete this exercise __________ times a day. Prone extension on elbows  1. Lie on your abdomen on a firm surface. A bed may be too soft for this exercise. 2. Prop yourself up on your elbows. 3. Use your arms to help lift your chest up until you feel a gentle stretch in your abdomen and your lower back. ? This will place some of your body weight on your elbows. If this is uncomfortable, try stacking pillows under your chest. ? Your hips should stay down, against the surface that you are lying on. Keep your hip and back muscles relaxed. 4. Hold this position for __________ seconds. 5. Slowly relax your upper body and return to the starting position. Repeat __________ times. Complete this exercise __________ times a day. Strengthening exercises These exercises build strength and endurance in your back. Endurance is the ability to use your muscles for a long time, even after they get tired. Pelvic tilt This exercise strengthens the muscles that lie deep in the abdomen. 1. Lie on your back on a firm surface.  Bend your knees and keep your feet flat on the floor. 2. Tense your abdominal muscles. Tip your pelvis up toward the ceiling and flatten your lower back into the floor. ? To help with this exercise, you may place a small towel under your lower back and try to push your back into the towel. 3. Hold this position for __________ seconds. 4. Let your muscles relax completely before you repeat this exercise. Repeat __________ times. Complete this exercise __________ times a day. Alternating arm  and leg raises  1. Get on your hands and knees on a firm surface. If you are on a hard floor, you may want to use padding, such as an exercise mat, to cushion your knees. 2. Line up your arms and legs. Your hands should be directly below your shoulders, and your knees should be directly below your hips. 3. Lift your left leg behind you. At the same time, raise your right arm and straighten it in front of you. ? Do not lift your leg higher than your hip. ? Do not lift your arm higher than your shoulder. ? Keep your abdominal and back muscles tight. ? Keep your hips facing the ground. ? Do not arch your back. ? Keep your balance carefully, and do not hold your breath. 4. Hold this position for __________ seconds. 5. Slowly return to the starting position. 6. Repeat with your right leg and your left arm. Repeat __________ times. Complete this exercise __________ times a day. Posture and body mechanics Good posture and healthy body mechanics can help to relieve stress in your body's tissues and joints. Body mechanics refers to the movements and positions of your body while you do your daily activities. Posture is part of body mechanics. Good posture means:  Your spine is in its natural S-curve position (neutral).  Your shoulders are pulled back slightly.  Your head is not tipped forward. Follow these guidelines to improve your posture and body mechanics in your everyday activities. Standing   When standing, keep your spine neutral and your feet about hip width apart. Keep a slight bend in your knees. Your ears, shoulders, and hips should line up.  When you do a task in which you stand in one place for a long time, place one foot up on a stable object that is 2-4 inches (5-10 cm) high, such as a footstool. This helps keep your spine neutral. Sitting   When sitting, keep your spine neutral and keep your feet flat on the floor. Use a footrest, if necessary, and keep your thighs parallel to  the floor. Avoid rounding your shoulders, and avoid tilting your head forward.  When working at a desk or a computer, keep your desk at a height where your hands are slightly lower than your elbows. Slide your chair under your desk so you are close enough to maintain good posture.  When working at a computer, place your monitor at a height where you are looking straight ahead and you do not have to tilt your head forward or downward to look at the screen. Resting  When lying down and resting, avoid positions that are most painful for you.  If you have pain with activities such as sitting, bending, stooping, or squatting, lie in a position in which your body does not bend very much. For example, avoid curling up on your side with your arms and knees near your chest (fetal position).  If you have pain with activities such as standing for  a long time or reaching with your arms, lie with your spine in a neutral position and bend your knees slightly. Try the following positions: ? Lying on your side with a pillow between your knees. ? Lying on your back with a pillow under your knees. Lifting   When lifting objects, keep your feet at least shoulder width apart and tighten your abdominal muscles.  Bend your knees and hips and keep your spine neutral. It is important to lift using the strength of your legs, not your back. Do not lock your knees straight out.  Always ask for help to lift heavy or awkward objects. This information is not intended to replace advice given to you by your health care provider. Make sure you discuss any questions you have with your health care provider. Document Revised: 11/07/2018 Document Reviewed: 08/07/2018 Elsevier Patient Education  2020 [employer]. .   Preventive Care 6-51 Years Old, Male Preventive care refers to lifestyle choices and visits with your health care provider that can promote health and wellness. This includes:  A yearly physical exam. This  is also called an annual well check.  Regular dental and eye exams.  Immunizations.  Screening for certain conditions.  Healthy lifestyle choices, such as eating a healthy diet, getting regular exercise, not using drugs or products that contain nicotine and tobacco, and limiting alcohol use. What can I expect for my preventive care visit? Physical exam Your health care provider will check:  Height and weight. These may be used to calculate body mass index (BMI), which is a measurement that tells if you are at a healthy weight.  Heart rate and blood pressure.  Your skin for abnormal spots. Counseling Your health care provider may ask you questions about:  Alcohol, tobacco, and drug use.  Emotional well-being.  Home and relationship well-being.  Sexual activity.  Eating habits.  Work and work Statistician. What immunizations do I need?  Influenza (flu) vaccine  This is recommended every year. Tetanus, diphtheria, and pertussis (Tdap) vaccine  You may need a Td booster every 10 years. Varicella (chickenpox) vaccine  You may need this vaccine if you have not already been vaccinated. Zoster (shingles) vaccine  You may need this after age 2. Measles, mumps, and rubella (MMR) vaccine  You may need at least one dose of MMR if you were born in 1957 or later. You may also need a second dose. Pneumococcal conjugate (PCV13) vaccine  You may need this if you have certain conditions and were not previously vaccinated. Pneumococcal polysaccharide (PPSV23) vaccine  You may need one or two doses if you smoke cigarettes or if you have certain conditions. Meningococcal conjugate (MenACWY) vaccine  You may need this if you have certain conditions. Hepatitis A vaccine  You may need this if you have certain conditions or if you travel or work in places where you may be exposed to hepatitis A. Hepatitis B vaccine  You may need this if you have certain conditions or if you  travel or work in places where you may be exposed to hepatitis B. Haemophilus influenzae type b (Hib) vaccine  You may need this if you have certain risk factors. Human papillomavirus (HPV) vaccine  If recommended by your health care provider, you may need three doses over 6 months. You may receive vaccines as individual doses or as more than one vaccine together in one shot (combination vaccines). Talk with your health care provider about the risks and benefits of combination vaccines. What  tests do I need? Blood tests  Lipid and cholesterol levels. These may be checked every 5 years, or more frequently if you are over 67 years old.  Hepatitis C test.  Hepatitis B test. Screening  Lung cancer screening. You may have this screening every year starting at age 88 if you have a 30-pack-year history of smoking and currently smoke or have quit within the past 15 years.  Prostate cancer screening. Recommendations will vary depending on your family history and other risks.  Colorectal cancer screening. All adults should have this screening starting at age 37 and continuing until age 64. Your health care provider may recommend screening at age 32 if you are at increased risk. You will have tests every 1-10 years, depending on your results and the type of screening test.  Diabetes screening. This is done by checking your blood sugar (glucose) after you have not eaten for a while (fasting). You may have this done every 1-3 years.  Sexually transmitted disease (STD) testing. Follow these instructions at home: Eating and drinking  Eat a diet that includes fresh fruits and vegetables, whole grains, lean protein, and low-fat dairy products.  Take vitamin and mineral supplements as recommended by your health care provider.  Do not drink alcohol if your health care provider tells you not to drink.  If you drink alcohol: ? Limit how much you have to 0-2 drinks a day. ? Be aware of how much  alcohol is in your drink. In the [place] one drink equals one 12 oz bottle of beer (355 mL), one 5 oz glass of wine (148 mL), or one 1 oz glass of hard liquor (44 mL). Lifestyle  Take daily care of your teeth and gums.  Stay active. Exercise for at least 30 minutes on 5 or more days each week.  Do not use any products that contain nicotine or tobacco, such as cigarettes, e-cigarettes, and chewing tobacco. If you need help quitting, ask your health care provider.  If you are sexually active, practice safe sex. Use a condom or other form of protection to prevent STIs (sexually transmitted infections).  Talk with your health care provider about taking a low-dose aspirin every day starting at age 49. What's next?  Go to your health care provider once a year for a well check visit.  Ask your health care provider how often you should have your eyes and teeth checked.  Stay up to date on all vaccines. This information is not intended to replace advice given to you by your health care provider. Make sure you discuss any questions you have with your health care provider. Document Revised: 07/10/2018 Document Reviewed: 07/10/2018 Elsevier Patient Education  2020 [employer].

## 2019-09-30 NOTE — Progress Notes (Signed)
Patient presents to clinic today for annual exam.  Patient is fasting for labs.  Acute Concerns: Patient c/o tenderness and redness at the base of the nail of the right third phalanx.  Notes was initially very tender with purulent drainage.  No further drainage.  Tenderness has improved slightly but still present.   Patient also endorses episodes of originating in the right buttock and radiating down the leg.  This happens only when he was in certain positions, mainly driving for prolonged periods of time.  As soon as position change of her symptoms resolved.  Denies any numbness, tingling or weakness of lower extremity  Chronic Issues: Hyperlipidemia -- Patient is currently on a regimen of Pravastatin 20 mg QD.  Endorses taking daily as directed.  Patient endorses keeping very active.  He is keeping a well-balanced, low-fat diet.   Health Maintenance: Immunizations -- UTD Colorectal Cancer Screening -- Due for repeat Cologuard. Last 09/18/2016.  Asymptomatic.  Agrees to repeat screening.   Past Medical History:  Diagnosis Date  . GERD (gastroesophageal reflux disease)   . H/O necrotizing fasciitis 2009   right upper thigh  . Hyperlipidemia    lower with diet changes  . Wears glasses   . Wears partial dentures     Past Surgical History:  Procedure Laterality Date  . COLONOSCOPY     declines, will refer for Cologuard 09/2015  . LEG SURGERY  2009   right upper leg,necrotizing fascitis  . NASAL SINUS SURGERY     age 34yo    Current Outpatient Medications on File Prior to Visit  Medication Sig Dispense Refill  . clotrimazole-betamethasone (LOTRISONE) cream Apply 1 application topically 2 (two) times daily. For 2 weeks. 30 g 0  . hydrocortisone (ANUSOL-HC) 25 MG suppository Place 1 suppository (25 mg total) rectally 2 (two) times daily. 12 suppository 0  . nystatin-triamcinolone ointment (MYCOLOG) Apply externally to the affected area twice daily. 60 g 0  . pravastatin  (PRAVACHOL) 20 MG tablet TAKE 1 TABLET(20 MG) BY MOUTH DAILY 90 tablet 1   No current facility-administered medications on file prior to visit.    Allergies  Allergen Reactions  . Penicillins Rash    Family History  Problem Relation Age of Onset  . Stroke Mother   . Dementia Mother        vascular  . Cancer Father 36       died of bladder cancer  . Osteoporosis Sister   . Diabetes Neg Hx   . Heart disease Neg Hx   . Hypertension Neg Hx   . Colon cancer Neg Hx     Social History   Socioeconomic History  . Marital status: Married    Spouse name: Not on file  . Number of children: Not on file  . Years of education: Not on file  . Highest education level: Not on file  Occupational History  . Not on file  Tobacco Use  . Smoking status: Former Smoker    Packs/day: 0.50    Years: 15.00    Pack years: 7.50    Start date: 06/20/2004  . Smokeless tobacco: Never Used  Substance and Sexual Activity  . Alcohol use: No    Alcohol/week: 0.0 standard drinks  . Drug use: No  . Sexual activity: Not Currently  Other Topics Concern  . Not on file  Social History Narrative   Married, 2 children from ex wife, exercise - 5 days per week, 40-50 minutes, kung fu, weight  training.  Walk nightly.   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 Difficulty of Paying Living Expenses: Not on file  Food Insecurity:   . Worried About Programme researcher, broadcasting/film/video in the Last Year: Not on file  . Ran Out of Food in the Last Year: Not on file  Transportation Needs:   . Lack of Transportation (Medical): Not on file  . Lack of Transportation (Non-Medical): Not on file  Physical Activity:   . Days of Exercise per Week: Not on file  . Minutes of Exercise per Session: Not on file  Stress:   . Feeling of Stress : Not on file  Social Connections:   . Frequency of Communication with Friends and Family: Not  on file  . Frequency of Social Gatherings with Friends and Family: Not on file  . Attends Religious Services: Not on file  . Active Member of Clubs or Organizations: Not on file  . Attends Banker Meetings: Not on file  . Marital Status: Not on file  Intimate Partner Violence:   . Fear of Current or Ex-Partner: Not on file  . Emotionally Abused: Not on file  . Physically Abused: Not on file  . Sexually Abused: Not on file   Review of Systems  Constitutional: Negative for fever and weight loss.  HENT: Negative for ear discharge, ear pain, hearing loss and tinnitus.   Eyes: Negative for blurred vision, double vision, photophobia and pain.  Respiratory: Negative for cough and shortness of breath.   Cardiovascular: Negative for chest pain and palpitations.  Gastrointestinal: Negative for abdominal pain, blood in stool, constipation, diarrhea, heartburn, melena, nausea and vomiting.  Genitourinary: Negative for dysuria, flank pain, frequency, hematuria and urgency.  Musculoskeletal: Positive for back pain. Negative for falls.  Neurological: Negative for dizziness, loss of consciousness and headaches.  Endo/Heme/Allergies: Negative for environmental allergies.  Psychiatric/Behavioral: Negative for depression, hallucinations, substance abuse and suicidal ideas. The patient is not nervous/anxious and does not have insomnia.    Resp 16   Ht 5\' 7"  (1.702 m)   Wt 184 lb (83.5 kg)   BMI 28.82 kg/m   Physical Exam Vitals reviewed.  Constitutional:      General: He is not in acute distress.    Appearance: Normal appearance. He is well-developed. He is not diaphoretic.  HENT:     Head: Normocephalic and atraumatic.     Right Ear: Tympanic membrane, ear canal and external ear normal.     Left Ear: Tympanic membrane, ear canal and external ear normal.     Nose: Nose normal.     Mouth/Throat:     Pharynx: No posterior oropharyngeal erythema.  Eyes:     Conjunctiva/sclera:  Conjunctivae normal.     Pupils: Pupils are equal, round, and reactive to light.  Neck:     Thyroid: No thyromegaly.  Cardiovascular:     Rate and Rhythm: Normal rate and regular rhythm.     Heart sounds: Normal heart sounds.  Pulmonary:     Effort: Pulmonary effort is normal. No respiratory distress.     Breath sounds: Normal breath sounds. No wheezing or rales.  Chest:     Chest wall: No tenderness.  Abdominal:     General: Bowel sounds are normal. There is no distension.     Palpations: Abdomen is soft. There is no mass.  Tenderness: There is no abdominal tenderness. There is no guarding or rebound.  Musculoskeletal:       Hands:     Cervical back: Normal and neck supple.     Thoracic back: Normal.     Lumbar back: No swelling, spasms, tenderness or bony tenderness. Normal range of motion. Negative right straight leg raise test and negative left straight leg raise test.  Lymphadenopathy:     Cervical: No cervical adenopathy.  Skin:    General: Skin is warm and dry.     Findings: No rash.  Neurological:     Mental Status: He is alert and oriented to person, place, and time.     Cranial Nerves: No cranial nerve deficit.     Assessment/Plan: 1. Visit for preventive health examination Depression screen negative. Health Maintenance reviewed. Preventive schedule discussed and handout given in AVS. Will obtain fasting labs today. - CBC with Differential/Platelet - Comprehensive metabolic panel - Lipid panel - Hemoglobin A1c  2. Prostate cancer screening He indicates understanding of the limitations of this screening test and wishes to proceed with screening PSA testing. - PSA  3. Colon cancer screening Due for repeat screen. Asymptomatic. Average risk. Agrees to repeat Cologuard. Order placed. - Cologuard  4. Hyperlipidemia, unspecified hyperlipidemia type Patient is taking medications as directed.  Encouraged him to continue with dietary changes and exercise  regimen.  Repeat fasting labs today. - Comprehensive metabolic panel - Lipid panel - Hemoglobin A1c  5. Paronychia of finger, left Mild and improving.  Discussed supportive measures.  Will Rx Bactroban topical cream to apply to the area.  Follow-up if not continuing to resolve. - mupirocin cream (BACTROBAN) 2 %; Apply thin layer twice daily to the affected area for 7 days  Dispense: 15 g; Refill: 0  6. Sciatica of right side Only positional.  Lower back stretching exercises rec mentation.  Driving modifications discussed.  Will monitor.   This visit occurred during the SARS-CoV-2 public health emergency.  Safety protocols were in place, including screening questions prior to the visit, additional usage of staff PPE, and extensive cleaning of exam room while observing appropriate contact time as indicated for disinfecting solutions.     Piedad Climes, PA-C

## 2019-10-01 NOTE — Telephone Encounter (Signed)
What is he using it for presently? Should not be ongoing medication.

## 2019-10-01 NOTE — Telephone Encounter (Signed)
He states he uses it for itchy skin. Having itchiness of the abdomen area his belt line. No rash He states he is using moisturizing lotions and creams with minimum relief

## 2019-10-01 NOTE — Telephone Encounter (Signed)
Patient wanted a refill of the Clotrimazole cream. Advised patient of PCP recommendations. He states he will just continue the Aleve OTC and use Neosporin or Bacitracin OTC for the finger.

## 2019-10-01 NOTE — Telephone Encounter (Signed)
Would recommend he use OTC Sarna lotion. Also apply powders to the area as discussed at visit to cut down on moisture in the waistband area as this + friction is going to cause itching/discomfort.

## 2019-10-02 NOTE — Telephone Encounter (Signed)
Spoke with patient and advised that the Clotrimazole cream is meant to be used for short period of time. PCP recommend using OTC Sarna lotion to help with itching and dry skin.  Patient is agreeable

## 2019-10-09 NOTE — Addendum Note (Signed)
Addended by: Waldon Merl on: 10/09/2019 03:20 PM   Modules accepted: Level of Service

## 2019-10-19 LAB — COLOGUARD: Cologuard: NEGATIVE

## 2019-10-26 ENCOUNTER — Other Ambulatory Visit: Payer: Self-pay | Admitting: Emergency Medicine

## 2019-10-26 DIAGNOSIS — E785 Hyperlipidemia, unspecified: Secondary | ICD-10-CM

## 2019-10-26 MED ORDER — PRAVASTATIN SODIUM 20 MG PO TABS: ORAL_TABLET | ORAL | 1 refills | Status: DC

## 2019-10-29 LAB — COLOGUARD: COLOGUARD: NEGATIVE

## 2019-10-29 LAB — EXTERNAL GENERIC LAB PROCEDURE: COLOGUARD: NEGATIVE

## 2019-11-02 ENCOUNTER — Telehealth: Payer: Self-pay | Admitting: Emergency Medicine

## 2019-11-02 NOTE — Telephone Encounter (Signed)
Received Cologuard results from [facility] portal. Cologuard completed on 10/19/19 with negative results. Will recheck in 3 years.

## 2019-11-02 NOTE — Telephone Encounter (Signed)
Perfect. Please notify patient of negative results. Great news!

## 2019-11-02 NOTE — Telephone Encounter (Signed)
Patient advised of negative cologuard results. He did request his lipid results from this year to compare to last year to be mailed.

## 2020-04-29 ENCOUNTER — Other Ambulatory Visit: Payer: Self-pay | Admitting: Physician Assistant

## 2020-04-29 DIAGNOSIS — E785 Hyperlipidemia, unspecified: Secondary | ICD-10-CM

## 2020-04-29 MED ORDER — PRAVASTATIN SODIUM 20 MG PO TABS: ORAL_TABLET | ORAL | 1 refills | Status: DC

## 2020-10-24 ENCOUNTER — Other Ambulatory Visit: Payer: Self-pay

## 2020-10-24 DIAGNOSIS — E785 Hyperlipidemia, unspecified: Secondary | ICD-10-CM

## 2020-10-24 MED ORDER — PRAVASTATIN SODIUM 20 MG PO TABS
ORAL_TABLET | ORAL | 0 refills | Status: DC
Start: 2020-10-24 — End: 2021-10-17

## 2021-01-21 ENCOUNTER — Other Ambulatory Visit: Payer: Self-pay | Admitting: Family Medicine

## 2021-01-21 DIAGNOSIS — E785 Hyperlipidemia, unspecified: Secondary | ICD-10-CM

## 2021-05-16 ENCOUNTER — Telehealth (INDEPENDENT_AMBULATORY_CARE_PROVIDER_SITE_OTHER): Payer: Medicare Other | Admitting: Family Medicine

## 2021-05-16 ENCOUNTER — Encounter: Payer: Self-pay | Admitting: Family Medicine

## 2021-05-16 VITALS — Temp 97.3°F

## 2021-05-16 DIAGNOSIS — J029 Acute pharyngitis, unspecified: Secondary | ICD-10-CM | POA: Diagnosis not present

## 2021-05-16 DIAGNOSIS — R059 Cough, unspecified: Secondary | ICD-10-CM | POA: Diagnosis not present

## 2021-05-16 MED ORDER — BENZONATATE 100 MG PO CAPS: 100.0000 mg | ORAL_CAPSULE | Freq: Three times a day (TID) | ORAL | 0 refills | Status: DC | PRN

## 2021-05-16 NOTE — Patient Instructions (Addendum)
-  I sent the medication(s) we discussed to your pharmacy: Meds ordered this encounter  Medications   benzonatate (TESSALON PERLES) 100 MG capsule    Sig: Take 1 capsule (100 mg total) by mouth 3 (three) times daily as needed.    Dispense:  20 capsule    Refill:  0   Stop the benadryl and try zyrtec once daily  Aleve if needed per instructions  Gargle warm salt water twice daily  Nasal saline after being in the leaves.   I hope you are feeling better soon!  Seek in person care promptly if your symptoms worsen, new concerns arise or you are not improving with treatment.  It was nice to meet you today. I help [facility] out with telemedicine visits on Tuesdays and Thursdays and am available for visits on those days. If you have any concerns or questions following this visit please schedule a follow up visit with your Primary Care doctor or seek care at a local urgent care clinic to avoid delays in care.

## 2021-05-16 NOTE — Progress Notes (Signed)
Virtual Visit via Video Note  I connected with Jared Coffey  on 05/16/21 at 12:20 PM EDT by a video enabled telemedicine application and verified that I am speaking with the correct person using two identifiers.  Location patient: home, [place] Location provider:work or home office Persons participating in the virtual visit: patient, provider, wife  I discussed the limitations of evaluation and management by telemedicine and the availability of in person appointments. The patient expressed understanding and agreed to proceed.   HPI:  Acute telemedicine visit for Sore Throat: -Onset: about 5 days ago -had negative covid test today -has been working in the leaves a lot -wife had pneumonia recently -Symptoms include: sore throat, pnd, dry cough -Denies: fevers, CP, SOB, NVD, fatigue -Has tried: benadryl -Pertinent past medical history: see below -Pertinent medication allergies:  Allergies  Allergen Reactions   Penicillins Rash  -COVID-19 vaccine status: reports had 2 doses and 1 booster of covid vaccine Immunization History  Administered Date(s) Administered   Hepatitis B 10/28/2012, 11/27/2012   Hepatitis B, adult 04/30/2013   Influenza,inj,Quad PF,6+ Mos 05/25/2014, 08/30/2016   PPD Test 10/13/2012, 10/28/2012, 06/28/2014   Tdap 10/13/2012   Zoster Recombinat (Shingrix) 11/16/2016, 03/06/2017    ROS: See pertinent positives and negatives per HPI.  Past Medical History:  Diagnosis Date   GERD (gastroesophageal reflux disease)    H/O necrotizing fasciitis 2009   right upper thigh   Hyperlipidemia    lower with diet changes   Wears glasses    Wears partial dentures     Past Surgical History:  Procedure Laterality Date   COLONOSCOPY     declines, will refer for Cologuard 09/2015   LEG SURGERY  2009   right upper leg,necrotizing fascitis   NASAL SINUS SURGERY     age 89yo     Current Outpatient Medications:    Omeprazole Magnesium (PRILOSEC PO), Take by mouth once a  week., Disp: , Rfl:    pravastatin (PRAVACHOL) 20 MG tablet, TAKE 1 TABLET(20 MG) BY MOUTH DAILY, Disp: 90 tablet, Rfl: 0  EXAM:  VITALS per patient if applicable:  GENERAL: alert, oriented, appears well and in no acute distress  HEENT: atraumatic, conjunttiva clear, no obvious abnormalities on inspection of external nose and ears  NECK: normal movements of the head and neck  LUNGS: on inspection no signs of respiratory distress, breathing rate appears normal, no obvious gross SOB, gasping or wheezing  CV: no obvious cyanosis  MS: moves all visible extremities without noticeable abnormality  PSYCH/NEURO: pleasant and cooperative, no obvious depression or anxiety, speech and thought processing grossly intact  ASSESSMENT AND PLAN:  Discussed the following assessment and plan:  Cough, unspecified type  Sore throat  -we discussed possible serious and likely etiologies, options for evaluation and workup, limitations of telemedicine visit vs in person visit, treatment, treatment risks and precautions. Pt is agreeable to treatment via telemedicine at this moment. Query VURI, seasonal allergic symptoms vs other. He opted to try zyrtec, nasal saline, salt water gargles, analgesic.  Advised to seek prompt in person care if worsening, new symptoms arise, or if is not improving with treatment over the next few days. Discussed options for inperson care if PCP office not available. Did let this patient know that I only do telemedicine on Tuesdays and Thursdays for [facility]. Advised to schedule follow up visit with PCP or [facility] if any further questions or concerns to avoid delays in care.   I discussed the assessment and treatment plan with the patient.  The patient was provided an opportunity to ask questions and all were answered. The patient agreed with the plan and demonstrated an understanding of the instructions.     Lucretia Kern, DO

## 2021-05-21 ENCOUNTER — Encounter (HOSPITAL_COMMUNITY): Payer: Self-pay | Admitting: Emergency Medicine

## 2021-05-21 ENCOUNTER — Emergency Department (HOSPITAL_COMMUNITY)
Admission: EM | Admit: 2021-05-21 | Discharge: 2021-05-21 | Disposition: A | Discharge status: Home / Self Care | Payer: Medicare Other | Attending: Emergency Medicine | Admitting: Emergency Medicine

## 2021-05-21 ENCOUNTER — Other Ambulatory Visit: Payer: Self-pay

## 2021-05-21 DIAGNOSIS — J029 Acute pharyngitis, unspecified: Secondary | ICD-10-CM | POA: Diagnosis present

## 2021-05-21 DIAGNOSIS — Z87891 Personal history of nicotine dependence: Secondary | ICD-10-CM | POA: Diagnosis not present

## 2021-05-21 MED ORDER — PREDNISONE 20 MG PO TABS
60.0000 mg | ORAL_TABLET | Freq: Once | ORAL | Status: AC
  Administered 2021-05-21: 60 mg via ORAL
  Filled 2021-05-21: qty 3

## 2021-05-21 MED ORDER — PREDNISONE 20 MG PO TABS: 40.0000 mg | ORAL_TABLET | Freq: Every day | ORAL | 0 refills | Status: AC

## 2021-05-21 NOTE — ED Triage Notes (Signed)
Pt came in c/o feeling like "my throat is closing up, and I'm not getting any better."  Pt is able to speak in complete sentences and does not appear SOB.  States he took the tesslon pearls and he had an allergic reaction where the "skin fell off his penis."

## 2021-05-21 NOTE — Discharge Instructions (Addendum)
You came to the emergency department today to be evaluated for your sore throat.  Your physical exam was reassuring.  Due to concern for possible allergic reaction I have started you on a short course of steroids.  This very important that you follow-up with your primary care provider.  Please discontinue taking the Tessalon and antibiotics you were prescribed.  I have given you a prescription for steroids today.  Some common side effects include feelings of extra energy, feeling warm, increased appetite, and stomach upset.  If you are diabetic your sugars may run higher than usual.   Get help right away if: You have difficulty breathing. You cannot swallow fluids, soft foods, or your saliva. You have increased swelling in your throat or neck. You have persistent nausea and vomiting.

## 2021-05-21 NOTE — ED Provider Notes (Signed)
[facility] EMERGENCY DEPARTMENT Provider Note   CSN: [id number] Arrival date & time: 05/21/21  1947     History Chief Complaint  Patient presents with   Sore Throat   Cough    Jared Coffey is a 65 y.o. male patient presents emergency department with a chief complaint of sore throat and "feeling like my throat is closing up."  Patient reports that his sore throat has been present over the last week.  States that feeling of throat closing has been present over the last 3 days.  This feeling started after he took 2 doses of Tessalon.  Patient reports that this feeling has gotten progressively worse over the last 3 days.  Patient also reports that after taking Tessalon "skin fell off my penis."  Patient reports that he followed up with urgent care after this happened and was told it was an allergic reaction.  Patient was prescribed antibiotics and told to stop taking Tessalon.  Patient denies any other rash or skin reaction.     Sore Throat This is a new problem. The current episode started more than 2 days ago. The problem occurs constantly. The problem has been gradually worsening. Pertinent negatives include no chest pain, no abdominal pain, no headaches and no shortness of breath. The symptoms are aggravated by swallowing. Nothing relieves the symptoms. He has tried rest and water for the symptoms.      Past Medical History:  Diagnosis Date   GERD (gastroesophageal reflux disease)    H/O necrotizing fasciitis 2009   right upper thigh   Hyperlipidemia    lower with diet changes   Wears glasses    Wears partial dentures     Patient Active Problem List   Diagnosis Date Noted   Visit for preventive health examination 09/04/2016   Prostate cancer screening 09/04/2016   Penile lesion 09/04/2016   Hyperlipidemia 10/21/2015    Past Surgical History:  Procedure Laterality Date   COLONOSCOPY     declines, will refer for Cologuard 09/2015   LEG SURGERY  2009    right upper leg,necrotizing fascitis   NASAL SINUS SURGERY     age 70yo       Family History  Problem Relation Age of Onset   Stroke Mother    Dementia Mother        vascular   Cancer Father 70       died of bladder cancer   Osteoporosis Sister    Diabetes Neg Hx    Heart disease Neg Hx    Hypertension Neg Hx    Colon cancer Neg Hx     Social History   Tobacco Use   Smoking status: Former    Packs/day: 0.50    Years: 15.00    Pack years: 7.50    Types: Cigarettes    Start date: 06/20/2004   Smokeless tobacco: Never  Vaping Use   Vaping Use: Never used  Substance Use Topics   Alcohol use: No    Alcohol/week: 0.0 standard drinks   Drug use: No    Home Medications Prior to Admission medications   Medication Sig Start Date End Date Taking? Authorizing Provider  benzonatate (TESSALON PERLES) 100 MG capsule Take 1 capsule (100 mg total) by mouth 3 (three) times daily as needed. 05/16/21   Terressa Koyanagi, DO  Omeprazole Magnesium (PRILOSEC PO) Take by mouth once a week.    [provider]  pravastatin (PRAVACHOL) 20 MG tablet TAKE 1 TABLET(20 MG)  BY MOUTH DAILY 10/24/20   Sheliah Hatch, MD    Allergies    Penicillins  Review of Systems   Review of Systems  Constitutional:  Negative for chills and fever.  HENT:  Positive for sore throat. Negative for drooling and trouble swallowing.   Eyes:  Negative for visual disturbance.  Respiratory:  Negative for cough and shortness of breath.   Cardiovascular:  Negative for chest pain.  Gastrointestinal:  Negative for abdominal pain, nausea and vomiting.  Genitourinary:  Positive for penile pain. Negative for difficulty urinating, dysuria, hematuria, penile discharge, penile swelling, scrotal swelling and testicular pain.  Musculoskeletal:  Negative for back pain and neck pain.  Skin:  Negative for color change and rash.  Neurological:  Negative for dizziness, syncope, light-headedness and headaches.   Psychiatric/Behavioral:  Negative for confusion.    Physical Exam Updated Vital Signs BP (!) 155/73 (BP Location: Right Arm)   Pulse 62   Temp 98.5 F (36.9 C) (Oral)   Resp 18   SpO2 99%   Physical Exam Vitals and nursing note reviewed. Exam conducted with a chaperone present (Male RN present as chaperone).  Constitutional:      General: He is not in acute distress.    Appearance: He is not ill-appearing, toxic-appearing or diaphoretic.  HENT:     Head: Normocephalic. No right periorbital erythema or left periorbital erythema.     Jaw: There is normal jaw occlusion. No trismus, tenderness, swelling, pain on movement or malocclusion.     Mouth/Throat:     Lips: Pink. No lesions.     Mouth: Mucous membranes are moist. No lacerations or angioedema.     Tongue: No lesions. Tongue does not deviate from midline.     Palate: No mass and lesions.     Pharynx: Oropharynx is clear. Uvula midline. Posterior oropharyngeal erythema present. No pharyngeal swelling, oropharyngeal exudate or uvula swelling.     Tonsils: No tonsillar exudate or tonsillar abscesses. 0 on the right. 0 on the left.     Comments: Palatal petechia noted.  Patient able to handle oral secretions without difficulty. Eyes:     General: No scleral icterus.       Right eye: No discharge.        Left eye: No discharge.  Neck:     Comments: No swelling to submandibular space Cardiovascular:     Rate and Rhythm: Normal rate.  Pulmonary:     Effort: Pulmonary effort is normal. No tachypnea, bradypnea or respiratory distress.     Breath sounds: Normal breath sounds. No stridor.     Comments: Patient speaks in full complete sentences without difficulty Genitourinary:    Pubic Area: No rash or pubic lice.      Penis: Normal and uncircumcised. No hypospadias, erythema, tenderness, discharge, swelling or lesions.      Testes: Normal. Cremasteric reflex is present.     Epididymis:     Right: Normal.     Left: Normal.      Tanner stage (genital): 5.     Comments: Desquamation to head of penis Musculoskeletal:     Cervical back: No edema, erythema, signs of trauma, rigidity, torticollis or crepitus. No pain with movement, spinous process tenderness or muscular tenderness. Normal range of motion.  Lymphadenopathy:     Cervical: No cervical adenopathy.  Skin:    General: Skin is warm and dry.  Neurological:     General: No focal deficit present.     Mental Status:  He is alert.     GCS: GCS eye subscore is 4. GCS verbal subscore is 5. GCS motor subscore is 6.  Psychiatric:        Behavior: Behavior is cooperative.    ED Results / Procedures / Treatments   Labs (all labs ordered are listed, but only abnormal results are displayed) Labs Reviewed - No data to display  EKG None  Radiology No results found.  Procedures Procedures   Medications Ordered in ED Medications  predniSONE (DELTASONE) tablet 60 mg (60 mg Oral Given 05/21/21 2134)    ED Course  I have reviewed the triage vital signs and the nursing notes.  Pertinent labs & imaging results that were available during my care of the patient were reviewed by me and considered in my medical decision making (see chart for details).    MDM Rules/Calculators/A&P                           Alert 65 year old male in no acute distress, nontoxic-appearing.  Presents with chief complaint of sore throat and feeling of throat closing up.  Feeling of throat closing up started after taking Tessalon 3 days prior.  Patient also reports desquamation to skin on head of his penis after taking a medication.  Patient has not taken this medication since.  Patient is in no acute respiratory distress.  Lungs clear to auscultation.  Patient has no swelling to oropharynx, oral mucosa or angioedema.  Patient able to handle oral secretions without difficulty.  Low suspicion for anaphylactic reaction at this time.  Concern for possible allergic reaction from Tessalon.  We  will add Tessalon to allergy list.  We will give patient prednisone and started on short course of this medication to help with any inflammation he is experiencing.  Patient also reports being prescribed Bactrim medication after Tessalon.  Advised patient to stop taking this medication due to concern for possible allergic reaction.  Patient to follow-up with primary care provider for repeat assessment.  Discussed results, findings, treatment and follow up. Patient advised of return precautions. Patient verbalized understanding and agreed with plan.  Patient care discussed with attending physician Dr. Hyacinth Meeker.  Final Clinical Impression(s) / ED Diagnoses Final diagnoses:  Sore throat    Rx / DC Orders ED Discharge Orders          Ordered    predniSONE (DELTASONE) 20 MG tablet  Daily        05/21/21 2129             Berneice Heinrich 05/21/21 2232    Eber Hong, MD 05/22/21 2032

## 2021-05-28 ENCOUNTER — Encounter (HOSPITAL_COMMUNITY): Payer: Self-pay | Admitting: Emergency Medicine

## 2021-05-28 ENCOUNTER — Emergency Department (HOSPITAL_COMMUNITY)
Admission: EM | Admit: 2021-05-28 | Discharge: 2021-05-28 | Disposition: A | Discharge status: Home / Self Care | Payer: Medicare Other | Attending: Emergency Medicine | Admitting: Emergency Medicine

## 2021-05-28 ENCOUNTER — Other Ambulatory Visit: Payer: Self-pay

## 2021-05-28 DIAGNOSIS — Z87891 Personal history of nicotine dependence: Secondary | ICD-10-CM | POA: Insufficient documentation

## 2021-05-28 DIAGNOSIS — R234 Changes in skin texture: Secondary | ICD-10-CM | POA: Diagnosis not present

## 2021-05-28 DIAGNOSIS — T483X5A Adverse effect of antitussives, initial encounter: Secondary | ICD-10-CM | POA: Diagnosis not present

## 2021-05-28 DIAGNOSIS — N4889 Other specified disorders of penis: Secondary | ICD-10-CM | POA: Insufficient documentation

## 2021-05-28 DIAGNOSIS — R0989 Other specified symptoms and signs involving the circulatory and respiratory systems: Secondary | ICD-10-CM | POA: Diagnosis not present

## 2021-05-28 DIAGNOSIS — T50905A Adverse effect of unspecified drugs, medicaments and biological substances, initial encounter: Secondary | ICD-10-CM

## 2021-05-28 MED ORDER — PREDNISONE 10 MG (21) PO TBPK: ORAL_TABLET | Freq: Every day | ORAL | 0 refills | Status: DC

## 2021-05-28 NOTE — Discharge Instructions (Addendum)
Your skin changes may be due to a drug reaction.  Please do not take benzonatate or doxycycline that was previously prescribed.  Take prednisone taper pack as prescribed.  Follow-up with your dermatologist in 3 days for further assessment.  Return if you have any concern.

## 2021-05-28 NOTE — ED Provider Notes (Signed)
[facility] EMERGENCY DEPARTMENT Provider Note   CSN: [id number] Arrival date & time: 05/28/21  1958     History Chief Complaint  Patient presents with   Penile Bleeding    Jared Coffey is a 65 y.o. male.  The history is provided by the patient and medical records. No language interpreter was used.   65 year old male who presents for evaluation of penile irritation.  Patient report 9 days ago he developed a cough.  He was prescribed benzonatate. He report after taking 2 doses of benzonatate he developed swelling to his throat as well as swelling to his penis.  He went to urgent care center who recommend patient to stop taking the cough medication and also prescribe him doxycycline.  Her symptoms persist, he went to the ED on 10/23.  He was told to discontinue both medication and was prescribed prednisone as well as Benadryl.  He has been taking the medication with improvement and he has finished with prednisone.  Today, he report swelling redness and oozing fluid from his penis.  Endorsed burning sensation with pain but no dysuria.  No fever no throat swelling, trouble swallowing, chest pain shortness of breath abdominal pain.  He denies any other environmental changes.  No new sexual partner.  No history of diabetes.  Report not sexually active for the past 7 years.    Past Medical History:  Diagnosis Date   GERD (gastroesophageal reflux disease)    H/O necrotizing fasciitis 2009   right upper thigh   Hyperlipidemia    lower with diet changes   Wears glasses    Wears partial dentures     Patient Active Problem List   Diagnosis Date Noted   Visit for preventive health examination 09/04/2016   Prostate cancer screening 09/04/2016   Penile lesion 09/04/2016   Hyperlipidemia 10/21/2015    Past Surgical History:  Procedure Laterality Date   COLONOSCOPY     declines, will refer for Cologuard 09/2015   LEG SURGERY  2009   right upper leg,necrotizing  fascitis   NASAL SINUS SURGERY     age 72yo       Family History  Problem Relation Age of Onset   Stroke Mother    Dementia Mother        vascular   Cancer Father 62       died of bladder cancer   Osteoporosis Sister    Diabetes Neg Hx    Heart disease Neg Hx    Hypertension Neg Hx    Colon cancer Neg Hx     Social History   Tobacco Use   Smoking status: Former    Packs/day: 0.50    Years: 15.00    Pack years: 7.50    Types: Cigarettes    Start date: 06/20/2004   Smokeless tobacco: Never  Vaping Use   Vaping Use: Never used  Substance Use Topics   Alcohol use: No    Alcohol/week: 0.0 standard drinks   Drug use: No    Home Medications Prior to Admission medications   Medication Sig Start Date End Date Taking? Authorizing Provider  benzonatate (TESSALON PERLES) 100 MG capsule Take 1 capsule (100 mg total) by mouth 3 (three) times daily as needed. 05/16/21   Terressa Koyanagi, DO  Omeprazole Magnesium (PRILOSEC PO) Take by mouth once a week.    [provider]  pravastatin (PRAVACHOL) 20 MG tablet TAKE 1 TABLET(20 MG) BY MOUTH DAILY 10/24/20   Sheliah Hatch,  MD    Allergies    Tessalon [benzonatate] and Penicillins  Review of Systems   Review of Systems  All other systems reviewed and are negative.  Physical Exam Updated Vital Signs BP (!) 154/78   Pulse 70   Temp 98.3 F (36.8 C) (Oral)   Resp 16   SpO2 99%   Physical Exam Vitals and nursing note reviewed.  Constitutional:      General: He is not in acute distress.    Appearance: He is well-developed.  HENT:     Head: Atraumatic.     Mouth/Throat:     Comments: Mouth: Partial denture noted, normal oral mucosa without any erythema or desquamation of skin.  Normal tongue. Eyes:     Conjunctiva/sclera: Conjunctivae normal.  Cardiovascular:     Rate and Rhythm: Normal rate and regular rhythm.     Pulses: Normal pulses.     Heart sounds: Normal heart sounds.  Pulmonary:     Effort:  Pulmonary effort is normal.     Breath sounds: Normal breath sounds. No wheezing.  Abdominal:     Palpations: Abdomen is soft.  Genitourinary:    Comments: Chaperone present during exam.  Evidence of desquamation of skin involving the glans of penis and the distal shaft of penis oozing some serous fluid. No ulceration, no petechial or vesicular rash. Musculoskeletal:     Cervical back: Neck supple.  Skin:    Findings: No rash.  Neurological:     Mental Status: He is alert. Mental status is at baseline.    ED Results / Procedures / Treatments   Labs (all labs ordered are listed, but only abnormal results are displayed) Labs Reviewed - No data to display  EKG None  Radiology No results found.  Procedures Procedures   Medications Ordered in ED Medications - No data to display  ED Course  I have reviewed the triage vital signs and the nursing notes.  Pertinent labs & imaging results that were available during my care of the patient were reviewed by me and considered in my medical decision making (see chart for details).    MDM Rules/Calculators/A&P                           BP (!) 154/78   Pulse 70   Temp 98.3 F (36.8 C) (Oral)   Resp 16   SpO2 99%   Final Clinical Impression(s) / ED Diagnoses Final diagnoses:  Drug-induced skin desquamation    Rx / DC Orders ED Discharge Orders          Ordered    predniSONE (STERAPRED UNI-PAK 21 TAB) 10 MG (21) TBPK tablet  Daily        05/28/21 2211           10:03 PM Patient was last seen about a week ago for desquamation of skin to his penis as well as throat irritation after taking Tessalon for cough.  Patient was prescribed prednisone for 5 days which he did report improvement of his symptoms.  He has been off his prednisone for the past 2 days and now noticed similar desquamation of skin to his penis similar to prior.  No oral mucosal involvement no rectal discomfort.  No other environmental changes.  No history  of diabetes, finding not consistent with candidiasis.  No ulceration to suggest syphilis.  Patient is not sexually active.  He does have an appointment with a dermatologist in 3  days.  At this time, I felt patient should resume taking prednisone as this could be Stevens-Johnson syndrome or some allergic manifestation.  He overall well-appearing, low suspicion for necrotizing fasciitis or other infectious symptoms.  Care discussed with Dr. Renaye Rakers.  Encourage pt to return if sxs worsen.     Fayrene Helper, PA-C 05/28/21 2212    Terald Sleeper, MD 05/28/21 [phone]

## 2021-05-28 NOTE — ED Triage Notes (Signed)
Patient reports bleeding at skin of his penis today .

## 2021-08-01 ENCOUNTER — Telehealth: Payer: Self-pay | Admitting: Physician Assistant

## 2021-08-01 NOTE — Telephone Encounter (Signed)
Needs an appt virtual or in office

## 2021-08-01 NOTE — Telephone Encounter (Signed)
Initial Comment Caller states they've the flu and wants medical advice. Translation No Nurse Assessment Nurse: Stefano Gaul, RN, Dwana Curd Date/Time (Eastern Time): 07/30/2021 2:37:21 PM Confirm and document reason for call. If symptomatic, describe symptoms. ---Caller states he has the flu. symptoms started 5 days ago. Has been gargling. he is coughing a lot with a sore throat. Chest hurts when he coughs. he is coughing up yellow sputum. Home COVID test was negative. has chills with sweating. Does the patient have any new or worsening symptoms? ---Yes Will a triage be completed? ---Yes Related visit to physician within the last 2 weeks? ---No Does the PT have any chronic conditions? (i.e. diabetes, asthma, this includes High risk factors for pregnancy, etc.) ---No Is this a behavioral health or substance abuse call? ---No Guidelines Guideline Title Affirmed Question Affirmed Notes Nurse Date/Time (Eastern Time) Influenza - Seasonal [1] Difficulty breathing AND [2] not severe AND [3] not from stuffy nose (e.g., not relieved by cleaning out the nose) Stefano Gaul, RN, Dwana Curd 07/30/2021 2:41:50 PM PLEASE NOTE: All timestamps contained within this report are represented as [place] Standard Time. CONFIDENTIALTY NOTICE: This fax transmission is intended only for the addressee. It contains information that is legally privileged, confidential or otherwise protected from use or disclosure. If you are not the intended recipient, you are strictly prohibited from reviewing, disclosing, copying using or disseminating any of this information or taking any action in reliance on or regarding this information. If you have received this fax in error, please notify us immediately by telephone so that we can arrange for its return to [place]. Phone: [phone], Toll-Free: [phone], Fax: [phone] Page: 2 of 2 Call Id: [id number] Disp. Time Lamount Cohen Time) Disposition Final User 07/30/2021 2:45:44 PM Go to ED Now  (or PCP triage) Yes Stefano Gaul, RN, Clerance Lav Disagree/Comply Comply Caller Understands Yes PreDisposition Did not know what to do Care Advice Given Per Guideline GO TO ED NOW (OR PCP TRIAGE): * IF NO PCP (PRIMARY CARE PROVIDER) SECOND-LEVEL TRIAGE: You need to be seen within the next hour. Go to the ED/UCC at _____________ Hospital. Leave as soon as you can. ANOTHER ADULT SHOULD DRIVE: * It is better and safer if another adult drives instead of you. CARE ADVICE given per Influenza - Seasonal (Adult) guideline. Referrals GO TO FACILITY UNDECIDED

## 2021-08-02 NOTE — Telephone Encounter (Signed)
I have LM asking pt to call back to schedule an appt to be seen

## 2021-08-02 NOTE — Telephone Encounter (Signed)
Patient needs virtual or office visit. Treatment cannot be given without evaluation.

## 2021-10-05 ENCOUNTER — Ambulatory Visit (INDEPENDENT_AMBULATORY_CARE_PROVIDER_SITE_OTHER): Payer: Medicare Other | Admitting: Registered Nurse

## 2021-10-05 ENCOUNTER — Encounter: Payer: Self-pay | Admitting: Registered Nurse

## 2021-10-05 VITALS — BP 156/62 | HR 66 | Temp 98.0°F | Resp 18 | Ht 67.0 in | Wt 185.6 lb

## 2021-10-05 DIAGNOSIS — S81801A Unspecified open wound, right lower leg, initial encounter: Secondary | ICD-10-CM

## 2021-10-05 MED ORDER — CLINDAMYCIN HCL 300 MG PO CAPS: 300.0000 mg | ORAL_CAPSULE | Freq: Three times a day (TID) | ORAL | 0 refills | Status: DC

## 2021-10-05 MED ORDER — TETANUS-DIPHTH-ACELL PERTUSSIS 5-2.5-18.5 LF-MCG/0.5 IM SUSP: 0.5000 mL | Freq: Once | INTRAMUSCULAR | 0 refills | Status: AC

## 2021-10-05 NOTE — Patient Instructions (Addendum)
Mr. Walling - ? ?Great to meet you ? ?Get tetanus shot and antibiotics at pharmacy ? ?Call if wound looks worse ? ?I'll see you in a few weeks ? ?Thanks, ? ?Rich  ? ? ? ?If you have lab work done today you will be contacted with your lab results within the next 2 weeks.  If you have not heard from [place] then please contact us. The fastest way to get your results is to register for My Chart. ? ? ?IF you received an x-ray today, you will receive an invoice from [facility]. Please contact [facility] at [phone] with questions or concerns regarding your invoice.  ? ?IF you received labwork today, you will receive an invoice from [facility]. Please contact [facility] at [phone] with questions or concerns regarding your invoice.  ? ?Our billing staff will not be able to assist you with questions regarding bills from these companies. ? ?You will be contacted with the lab results as soon as they are available. The fastest way to get your results is to activate your My Chart account. Instructions are located on the last page of this paperwork. If you have not heard from [place] regarding the results in 2 weeks, please contact this office. ?  ? ? ?

## 2021-10-10 ENCOUNTER — Other Ambulatory Visit: Payer: Self-pay | Admitting: Registered Nurse

## 2021-10-10 DIAGNOSIS — S81801A Unspecified open wound, right lower leg, initial encounter: Secondary | ICD-10-CM

## 2021-10-10 NOTE — Telephone Encounter (Signed)
Pt notes he left what was left of abx on table at work and needs replacement if he is to finish course   ?

## 2021-10-10 NOTE — Telephone Encounter (Signed)
Pt called in asking if we can call in the clindamycin, he states that he left it in the cafeteria at work and when he went back they were gone.  ? ?Pt uses [facility] in [place]   ?

## 2021-10-16 NOTE — Progress Notes (Signed)
? ?Established Patient Office Visit ? ?Subjective:  ?Patient ID: Jared Coffey, male    DOB: 28-Jan-1956  Age: 66 y.o. MRN: [id number] ? ?CC:  ?Chief Complaint  ?Patient presents with  ? Wound Infection  ?  Patient states he was working with a axe last week and hit his leg. Patient states he would like to get the cut looked at to see if it is infected because it was swollen but it has now went down with no pain  ? ? ?HPI ?Jared Coffey presents for wound ? ?Was working with an axe last week and accidentally struck leg. ?Open wound. Treated with cleaning with soapy water and topical OTC abx. ?Has improved. Still red and tender. Still some swelling ?Can bear weight without issue.  ?No red streaking.  ?No distal redness/swelling/suggestion of clot. ? ?Otherwise no concerns. ? ?Past Medical History:  ?Diagnosis Date  ? GERD (gastroesophageal reflux disease)   ? H/O necrotizing fasciitis 2009  ? right upper thigh  ? Hyperlipidemia   ? lower with diet changes  ? Wears glasses   ? Wears partial dentures   ? ? ?Past Surgical History:  ?Procedure Laterality Date  ? COLONOSCOPY    ? declines, will refer for Cologuard 09/2015  ? LEG SURGERY  2009  ? right upper leg,necrotizing fascitis  ? NASAL SINUS SURGERY    ? age 66yo  ? ? ?Family History  ?Problem Relation Age of Onset  ? Stroke Mother   ? Dementia Mother   ?     vascular  ? Cancer Father [id number]  ?     died of bladder cancer  ? Osteoporosis Sister   ? Diabetes Neg Hx   ? Heart disease Neg Hx   ? Hypertension Neg Hx   ? Colon cancer Neg Hx   ? ? ?Social History  ? ?Socioeconomic History  ? Marital status: Married  ?  Spouse name: Not on file  ? Number of children: Not on file  ? Years of education: Not on file  ? Highest education level: Not on file  ?Occupational History  ? Not on file  ?Tobacco Use  ? Smoking status: Former  ?  Packs/day: 0.50  ?  Years: 15.00  ?  Pack years: 7.50  ?  Types: Cigarettes  ?  Start date: 06/20/2004  ? Smokeless tobacco: Never  ?Vaping Use  ?  Vaping Use: Never used  ?Substance and Sexual Activity  ? Alcohol use: No  ?  Alcohol/week: 0.0 standard drinks  ? Drug use: No  ? Sexual activity: Not Currently  ?Other Topics Concern  ? Not on file  ?Social History Narrative  ? Married, 2 children from ex wife, exercise - 5 days per week, 40-50 minutes, kung fu, weight training.  Walk nightly.   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 ?Social Determinants of Health  ? ?Financial Resource Strain: Not on file  ?Food Insecurity: Not on file  ?Transportation Needs: Not on file  ?Physical Activity: Not on file  ?Stress: Not on file  ?Social Connections: Not on file  ?Intimate Partner Violence: Not on file  ? ? ?Outpatient Medications Prior to Visit  ?Medication Sig Dispense Refill  ? Omeprazole Magnesium (PRILOSEC PO) Take by mouth once a week.    ? pravastatin (PRAVACHOL) 20 MG tablet TAKE 1 TABLET(20 MG) BY MOUTH DAILY 90 tablet 0  ? predniSONE (STERAPRED UNI-PAK 21 TAB) 10 MG (21) TBPK  tablet Take by mouth daily. Take 6 tabs by mouth daily  for 2 days, then 5 tabs for 2 days, then 4 tabs for 2 days, then 3 tabs for 2 days, 2 tabs for 2 days, then 1 tab by mouth daily for 2 days (Patient not taking: Reported on 10/05/2021) 42 tablet 0  ? ?No facility-administered medications prior to visit.  ? ? ?Allergies  ?Allergen Reactions  ? Tessalon [Benzonatate]   ? Penicillins Rash  ? ? ?ROS ?Review of Systems  ?Constitutional: Negative.   ?HENT: Negative.    ?Eyes: Negative.   ?Respiratory: Negative.    ?Cardiovascular: Negative.   ?Gastrointestinal: Negative.   ?Genitourinary: Negative.   ?Musculoskeletal: Negative.   ?Skin: Negative.   ?Neurological: Negative.   ?Psychiatric/Behavioral: Negative.    ?All other systems reviewed and are negative. ? ?  ?Objective:  ?  ?Physical Exam ?Constitutional:   ?   General: He is not in acute distress. ?   Appearance: Normal appearance. He is normal weight. He is not ill-appearing, toxic-appearing  or diaphoretic.  ?Cardiovascular:  ?   Rate and Rhythm: Normal rate and regular rhythm.  ?   Heart sounds: Normal heart sounds. No murmur heard. ?  No friction rub. No gallop.  ?Pulmonary:  ?   Effort: Pulmonary effort is normal. No respiratory distress.  ?   Breath sounds: Normal breath sounds. No stridor. No wheezing, rhonchi or rales.  ?Chest:  ?   Chest wall: No tenderness.  ?Skin: ?   Findings: Lesion (wound anterior L leg. some local redness and swelling. no drainage.) present.  ?Neurological:  ?   General: No focal deficit present.  ?   Mental Status: He is alert and oriented to person, place, and time. Mental status is at baseline.  ?Psychiatric:     ?   Mood and Affect: Mood normal.     ?   Behavior: Behavior normal.     ?   Thought Content: Thought content normal.     ?   Judgment: Judgment normal.  ? ? ?BP (!) 156/62   Pulse 66   Temp 98 ?F (36.7 ?C) (Temporal)   Resp 18   Ht 5\' 7"  (1.702 m)   Wt 185 lb 9.6 oz (84.2 kg)   SpO2 99%   BMI 29.07 kg/m?  ?Wt Readings from Last 3 Encounters:  ?10/05/21 185 lb 9.6 oz (84.2 kg)  ?09/30/19 184 lb (83.5 kg)  ?06/22/19 182 lb (82.6 kg)  ? ? ? ?Health Maintenance Due  ?Topic Date Due  ? Pneumonia Vaccine 65+ Years old (1 - PCV) Never done  ? INFLUENZA VACCINE  02/27/2021  ? ? ?There are no preventive care reminders to display for this patient. ? ?Lab Results  ?Component Value Date  ? TSH 1.80 09/04/2016  ? ?Lab Results  ?Component Value Date  ? WBC 6.0 09/30/2019  ? HGB 14.4 09/30/2019  ? HCT 41.5 09/30/2019  ? MCV 88.8 09/30/2019  ? PLT 294.0 09/30/2019  ? ?Lab Results  ?Component Value Date  ? NA 139 09/30/2019  ? K 4.5 09/30/2019  ? CO2 28 09/30/2019  ? GLUCOSE 85 09/30/2019  ? BUN 19 09/30/2019  ? CREATININE 1.02 09/30/2019  ? BILITOT 0.7 09/30/2019  ? ALKPHOS 65 09/30/2019  ? AST 22 09/30/2019  ? ALT 22 09/30/2019  ? PROT 6.6 09/30/2019  ? ALBUMIN 4.2 09/30/2019  ? CALCIUM 9.5 09/30/2019  ? GFR 73.31 09/30/2019  ? ?Lab Results  ?Component Value Date  ?  CHOL 180 09/30/2019  ? ?Lab Results  ?Component Value Date  ? HDL 53.20 09/30/2019  ? ?Lab Results  ?Component Value Date  ? LDLCALC 106 (H) 09/30/2019  ? ?Lab Results  ?Component Value Date  ? TRIG 103.0 09/30/2019  ? ?Lab Results  ?Component Value Date  ? CHOLHDL 3 09/30/2019  ? ?Lab Results  ?Component Value Date  ? HGBA1C 5.4 09/30/2019  ? ? ?  ?Assessment & Plan:  ? ?Problem List Items Addressed This Visit   ?None ?Visit Diagnoses   ? ? Wound of right lower extremity, initial encounter    -  Primary  ? Relevant Medications  ? clindamycin (CLEOCIN) 300 MG capsule  ? ?  ? ? ?Meds ordered this encounter  ?Medications  ? clindamycin (CLEOCIN) 300 MG capsule  ?  Sig: Take 1 capsule (300 mg total) by mouth 3 (three) times daily.  ?  Dispense:  21 capsule  ?  Refill:  0  ?  Order Specific Question:   Supervising Provider  ?  Answer:   Neva Seat, JEFFREY R [[id number]]  ? Tdap (BOOSTRIX) 5-2.5-18.5 LF-MCG/0.5 injection  ?  Sig: Inject 0.5 mLs into the muscle once for 1 dose.  ?  Dispense:  0.5 mL  ?  Refill:  0  ?  Order Specific Question:   Supervising Provider  ?  Answer:   Neva Seat, JEFFREY R [[id number]]  ? ? ?Follow-up: Return if symptoms worsen or fail to improve, for as scheduled.  ? ?PLAN ?Tdap booster as his was >5 years ago. ?Clindamycin as above for bacterial infection treatment ?Return if worsening or failing to improve ?Will recheck at pt upcoming physical ?Patient encouraged to call clinic with any questions, comments, or concerns. ? ?Janeece Agee, NP ?

## 2021-10-17 ENCOUNTER — Other Ambulatory Visit: Payer: Self-pay

## 2021-10-17 ENCOUNTER — Ambulatory Visit (INDEPENDENT_AMBULATORY_CARE_PROVIDER_SITE_OTHER): Payer: Medicare Other | Admitting: Registered Nurse

## 2021-10-17 ENCOUNTER — Encounter: Payer: Self-pay | Admitting: Registered Nurse

## 2021-10-17 VITALS — BP 151/57 | HR 62 | Temp 98.1°F | Resp 18 | Ht 67.0 in | Wt 185.0 lb

## 2021-10-17 DIAGNOSIS — E785 Hyperlipidemia, unspecified: Secondary | ICD-10-CM

## 2021-10-17 DIAGNOSIS — Z13220 Encounter for screening for lipoid disorders: Secondary | ICD-10-CM

## 2021-10-17 DIAGNOSIS — Z13228 Encounter for screening for other metabolic disorders: Secondary | ICD-10-CM

## 2021-10-17 DIAGNOSIS — E559 Vitamin D deficiency, unspecified: Secondary | ICD-10-CM | POA: Diagnosis not present

## 2021-10-17 DIAGNOSIS — Z130 Encounter for screening for diseases of the blood and blood-forming organs and certain disorders involving the immune mechanism: Secondary | ICD-10-CM | POA: Diagnosis not present

## 2021-10-17 DIAGNOSIS — Z125 Encounter for screening for malignant neoplasm of prostate: Secondary | ICD-10-CM | POA: Diagnosis not present

## 2021-10-17 DIAGNOSIS — Z0000 Encounter for general adult medical examination without abnormal findings: Secondary | ICD-10-CM

## 2021-10-17 DIAGNOSIS — Z1329 Encounter for screening for other suspected endocrine disorder: Secondary | ICD-10-CM

## 2021-10-17 LAB — CBC WITH DIFFERENTIAL/PLATELET
Basophils Absolute: 0 10*3/uL (ref 0.0–0.1)
Basophils Relative: 0.6 % (ref 0.0–3.0)
Eosinophils Absolute: 0.1 10*3/uL (ref 0.0–0.7)
Eosinophils Relative: 2.6 % (ref 0.0–5.0)
HCT: 43.5 % (ref 39.0–52.0)
Hemoglobin: 14.8 g/dL (ref 13.0–17.0)
Lymphocytes Relative: 32.6 % (ref 12.0–46.0)
Lymphs Abs: 1.7 10*3/uL (ref 0.7–4.0)
MCHC: 33.9 g/dL (ref 30.0–36.0)
MCV: 90.4 fl (ref 78.0–100.0)
Monocytes Absolute: 0.5 10*3/uL (ref 0.1–1.0)
Monocytes Relative: 9.7 % (ref 3.0–12.0)
Neutro Abs: 2.8 10*3/uL (ref 1.4–7.7)
Neutrophils Relative %: 54.5 % (ref 43.0–77.0)
Platelets: 285 10*3/uL (ref 150.0–400.0)
RBC: 4.81 Mil/uL (ref 4.22–5.81)
RDW: 13.1 % (ref 11.5–15.5)
WBC: 5.1 10*3/uL (ref 4.0–10.5)

## 2021-10-17 LAB — LIPID PANEL
Cholesterol: 204 mg/dL — ABNORMAL HIGH (ref 0–200)
HDL: 62.5 mg/dL (ref >39.00)
LDL Cholesterol: 125 mg/dL — ABNORMAL HIGH (ref 0–99)
NonHDL: 141.91
Total CHOL/HDL Ratio: 3
Triglycerides: 87 mg/dL (ref 0.0–149.0)
VLDL: 17.4 mg/dL (ref 0.0–40.0)

## 2021-10-17 LAB — COMPREHENSIVE METABOLIC PANEL
ALT: 23 U/L (ref 0–53)
AST: 25 U/L (ref 0–37)
Albumin: 4.5 g/dL (ref 3.5–5.2)
Alkaline Phosphatase: 54 U/L (ref 39–117)
BUN: 20 mg/dL (ref 6–23)
CO2: 29 mEq/L (ref 19–32)
Calcium: 9.5 mg/dL (ref 8.4–10.5)
Chloride: 100 mEq/L (ref 96–112)
Creatinine, Ser: 1 mg/dL (ref 0.40–1.50)
GFR: 78.16 mL/min (ref >60.00)
Glucose, Bld: 80 mg/dL (ref 70–99)
Potassium: 4.2 mEq/L (ref 3.5–5.1)
Sodium: 138 mEq/L (ref 135–145)
Total Bilirubin: 0.8 mg/dL (ref 0.2–1.2)
Total Protein: 6.7 g/dL (ref 6.0–8.3)

## 2021-10-17 LAB — HEMOGLOBIN A1C: Hgb A1c MFr Bld: 5.5 % (ref 4.6–6.5)

## 2021-10-17 LAB — PSA, MEDICARE: PSA: 0.5 ng/ml (ref 0.10–4.00)

## 2021-10-17 LAB — VITAMIN D 25 HYDROXY (VIT D DEFICIENCY, FRACTURES): VITD: 37.94 ng/mL (ref 30.00–100.00)

## 2021-10-17 LAB — TSH: TSH: 1.78 u[IU]/mL (ref 0.35–5.50)

## 2021-10-17 MED ORDER — PRAVASTATIN SODIUM 20 MG PO TABS: ORAL_TABLET | ORAL | 3 refills | Status: DC

## 2021-10-17 NOTE — Progress Notes (Signed)
Established Patient Office Visit  Subjective:  Patient ID: Jared Coffey, male    DOB: [birth year]  Age: 66 y.o. MRN: [id number]  CC:  Chief Complaint  Patient presents with   Transitions Of Care    Patient states he is here for a TOC and CPE.    HPI [name] presents for CPE  No acute concerns  Histories reviewed and updated with patient.   HLD Pravastatin 20mg  po qd Lab Results  Component Value Date   CHOL 180 09/30/2019   HDL 53.20 09/30/2019   LDLCALC 106 (H) 09/30/2019   LDLDIRECT 103.0 09/04/2016   TRIG 103.0 09/30/2019   CHOLHDL 3 09/30/2019     Past Medical History:  Diagnosis Date   GERD (gastroesophageal reflux disease)    H/O necrotizing fasciitis 2009   right upper thigh   Hyperlipidemia    lower with diet changes   Wears glasses    Wears partial dentures     Past Surgical History:  Procedure Laterality Date   COLONOSCOPY     declines, will refer for Cologuard 09/2015   LEG SURGERY  2009   right upper leg,necrotizing fascitis   NASAL SINUS SURGERY     age 22yo    Family History  Problem Relation Age of Onset   Stroke Mother    Dementia Mother        vascular   Cancer Father 74       died of bladder cancer   Osteoporosis Sister    Diabetes Neg Hx    Heart disease Neg Hx    Hypertension Neg Hx    Colon cancer Neg Hx     Social History   Socioeconomic History   Marital status: Married    Spouse name: Not on file   Number of children: Not on file   Years of education: Not on file   Highest education level: Not on file  Occupational History   Not on file  Tobacco Use   Smoking status: Former    Packs/day: 0.50    Years: 15.00    Pack years: 7.50    Types: Cigarettes    Start date: 06/20/2004   Smokeless tobacco: Never  Vaping Use   Vaping Use: Never used  Substance and Sexual Activity   Alcohol use: No    Alcohol/week: 0.0 standard drinks   Drug use: No   Sexual activity: Not Currently  Other Topics Concern    Not on file  Social History Narrative   Married, 2 children from ex wife, exercise - 5 days per week, 40-50 minutes, kung fu, weight training.  Walk nightly.   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     pravastatin (PRAVACHOL) 20 MG tablet TAKE 1 TABLET(20 MG) BY MOUTH DAILY 90 tablet 0   clindamycin (CLEOCIN) 300 MG capsule Take 1 capsule (300 mg total) by mouth 3 (three) times daily. 21 capsule 0   predniSONE (STERAPRED UNI-PAK 21 TAB) 10 MG (21) TBPK tablet Take by mouth daily.  Take 6 tabs by mouth daily  for 2 days, then 5 tabs for 2 days, then 4 tabs for 2 days, then 3 tabs for 2 days, 2 tabs for 2 days, then 1 tab by mouth daily for 2 days (Patient not taking: Reported on 10/17/2021) 42 tablet 0   No facility-administered medications prior to visit.    Allergies  Allergen Reactions   Tessalon [Benzonatate]    Penicillins Rash    ROS Review of Systems  Constitutional: Negative.   HENT: Negative.    Eyes: Negative.   Respiratory: Negative.    Cardiovascular: Negative.   Gastrointestinal: Negative.   Genitourinary: Negative.   Musculoskeletal: Negative.   Skin: Negative.   Neurological: Negative.   Psychiatric/Behavioral: Negative.    All other systems reviewed and are negative.    Objective:    Physical Exam Vitals and nursing note reviewed.  Constitutional:      General: He is not in acute distress.    Appearance: Normal appearance. He is normal weight. He is not ill-appearing, toxic-appearing or diaphoretic.  HENT:     Head:  Normocephalic and atraumatic.     Right Ear: Tympanic membrane, ear canal and external ear normal. There is no impacted cerumen.     Left Ear: Tympanic membrane, ear canal and external ear normal. There is no impacted cerumen.     Nose: Nose normal. No congestion or rhinorrhea.     Mouth/Throat:     Mouth: Mucous membranes are moist.     Pharynx: Oropharynx is clear. No oropharyngeal exudate or posterior oropharyngeal erythema.  Eyes:     General: No scleral icterus.       Right eye: No discharge.        Left eye: No discharge.     Extraocular Movements: Extraocular movements intact.     Conjunctiva/sclera: Conjunctivae normal.     Pupils: Pupils are equal, round, and reactive to light.  Neck:     Vascular: No carotid bruit.  Cardiovascular:     Rate and Rhythm: Normal rate and regular rhythm.     Pulses: Normal pulses.     Heart sounds: Normal heart sounds. No murmur heard.   No friction rub. No gallop.  Pulmonary:     Effort: Pulmonary effort is normal. No respiratory distress.     Breath sounds: Normal breath sounds. No stridor. No wheezing, rhonchi or rales.  Chest:     Chest wall: No tenderness.  Abdominal:     General: Abdomen is flat. Bowel sounds are normal. There is no distension.     Palpations: Abdomen is soft. There is no mass.     Tenderness: There is no abdominal tenderness. There is no right CVA tenderness, left CVA tenderness, guarding or rebound.     Hernia: No hernia is present.  Musculoskeletal:        General: No swelling, tenderness, deformity or signs of injury. Normal range of motion.     Cervical back: Normal range of motion and neck supple. No rigidity or tenderness.     Right lower leg: No edema.     Left lower leg: No edema.  Lymphadenopathy:     Cervical: No cervical adenopathy.  Skin:    General: Skin is warm and dry.     Capillary Refill: Capillary refill takes less than 2 seconds.     Coloration: Skin is not jaundiced or pale.     Findings: No  bruising, erythema, lesion or rash.  Neurological:  General: No focal deficit present.     Mental Status: He is alert and oriented to person, place, and time. Mental status is at baseline.     Cranial Nerves: No cranial nerve deficit.     Motor: No weakness.     Gait: Gait normal.  Psychiatric:        Mood and Affect: Mood normal.        Behavior: Behavior normal.        Thought Content: Thought content normal.        Judgment: Judgment normal.    BP (!) 151/57   Pulse 62   Temp 98.1 F (36.7 C) (Temporal)   Resp 18   Ht 5\' 7"  (1.702 m)   Wt 185 lb (83.9 kg)   SpO2 100%   BMI 28.98 kg/m  Wt Readings from Last 3 Encounters:  10/17/21 185 lb (83.9 kg)  10/05/21 185 lb 9.6 oz (84.2 kg)  09/30/19 184 lb (83.5 kg)     There are no preventive care reminders to display for this patient.  There are no preventive care reminders to display for this patient.  Lab Results  Component Value Date   TSH 1.80 09/04/2016   Lab Results  Component Value Date   WBC 6.0 09/30/2019   HGB 14.4 09/30/2019   HCT 41.5 09/30/2019   MCV 88.8 09/30/2019   PLT 294.0 09/30/2019   Lab Results  Component Value Date   NA 139 09/30/2019   K 4.5 09/30/2019   CO2 28 09/30/2019   GLUCOSE 85 09/30/2019   BUN 19 09/30/2019   CREATININE 1.02 09/30/2019   BILITOT 0.7 09/30/2019   ALKPHOS 65 09/30/2019   AST 22 09/30/2019   ALT 22 09/30/2019   PROT 6.6 09/30/2019   ALBUMIN 4.2 09/30/2019   CALCIUM 9.5 09/30/2019   GFR 73.31 09/30/2019   Lab Results  Component Value Date   CHOL 180 09/30/2019   Lab Results  Component Value Date   HDL 53.20 09/30/2019   Lab Results  Component Value Date   LDLCALC 106 (H) 09/30/2019   Lab Results  Component Value Date   TRIG 103.0 09/30/2019   Lab Results  Component Value Date   CHOLHDL 3 09/30/2019   Lab Results  Component Value Date   HGBA1C 5.4 09/30/2019      Assessment & Plan:   Problem List Items Addressed This Visit        Other   Hyperlipidemia   Other Visit Diagnoses     Annual physical exam    -  Primary   Lipid screening       Screening for endocrine, metabolic and immunity disorder       Screening PSA (prostate specific antigen)           No orders of the defined types were placed in this encounter.   Follow-up: Return in about 1 year (around 10/18/2022) for CPE and labs.   PLAN Exam unremarkable Labs collected. Will follow up with the patient as warranted. Return annually Hm reviewed, uptodate. Patient encouraged to call clinic with any questions, comments, or concerns.  Janeece Agee, NP

## 2021-10-17 NOTE — Patient Instructions (Addendum)
Mr. Jared Coffey -  ? ?Great to see you! Glad the leg is healing up well. ? ?I will check labs. I will call with any concerns. ? ?See you in a year, sooner if concerns arise. ? ?Thank you, ? ?Rich  ? ? ? ?If you have lab work done today you will be contacted with your lab results within the next 2 weeks.  If you have not heard from [place] then please contact us. The fastest way to get your results is to register for My Chart. ? ? ?IF you received an x-ray today, you will receive an invoice from [facility]. Please contact [facility] at [phone] with questions or concerns regarding your invoice.  ? ?IF you received labwork today, you will receive an invoice from [facility]. Please contact [facility] at [phone] with questions or concerns regarding your invoice.  ? ?Our billing staff will not be able to assist you with questions regarding bills from these companies. ? ?You will be contacted with the lab results as soon as they are available. The fastest way to get your results is to activate your My Chart account. Instructions are located on the last page of this paperwork. If you have not heard from [place] regarding the results in 2 weeks, please contact this office. ?  ? ? ?

## 2021-10-20 ENCOUNTER — Telehealth: Payer: Self-pay

## 2021-10-20 NOTE — Telephone Encounter (Signed)
Pt called wanting to know if he needs to continue to take the Vitamin D  ?

## 2021-10-23 NOTE — Telephone Encounter (Signed)
He does not have to, but it may be advisable to prevent future low levels.  ? ?Thanks, ? ?Rich

## 2021-10-25 NOTE — Telephone Encounter (Signed)
Attempted to call patient about medication. LVM to return call  °

## 2022-05-01 ENCOUNTER — Encounter: Payer: Self-pay | Admitting: Surgery

## 2022-05-01 DIAGNOSIS — L290 Pruritus ani: Secondary | ICD-10-CM | POA: Insufficient documentation

## 2022-05-01 DIAGNOSIS — K642 Third degree hemorrhoids: Secondary | ICD-10-CM | POA: Insufficient documentation

## 2022-06-08 ENCOUNTER — Encounter: Payer: Self-pay | Admitting: Neurology

## 2022-07-20 NOTE — Progress Notes (Signed)
Initial neurology clinic note  SERVICE DATE: 08/02/22  Reason for Evaluation: Consultation requested by Melina Fiddler, MD for an opinion regarding hand numbness and tingling. My final recommendations will be communicated back to the requesting physician by way of shared medical record or letter to requesting physician via [place] mail.  HPI: This is Mr. Jared Coffey, a 66 y.o. right-handed male with a medical history of HLD, GERD, OA, former smoker who presents to neurology clinic with the chief complaint of hand numbness and tingling. The patient is alone today.  Patient has numbness and tingling in his right hand more than the left hand. It is the whole hand. It is worse when he is working or driving. It does not wake him up at night. He has been told he had carpal tunnel syndrome in the past. His hands can be weak when he works with them a lot, but not usually weak. Patient has tried braces in the past, but is not sure it benefited. Patient has never had an EMG.  Patient has had neck pain for the last couple of years as well. He takes alleve for this. Alleve also helps with the hand pain. He had a couple of episodes of numbness on the left side of face that usually would occur when doing yoga and bending.   He has not done any physical therapy for the neck.  He denies any constitutional symptoms like fever, night sweats, anorexia or unintentional weight loss.  EtOH use: none  Restrictive diet? No, but does not eat a lot of meat Family history of neuropathy/myopathy/NM disease? Mother also had numbness in arms   MEDICATIONS:  Outpatient Encounter Medications as of 08/02/2022  Medication Sig   fluticasone (CUTIVATE) 0.005 % ointment Apply 1 Application topically daily.   Omeprazole Magnesium (PRILOSEC PO) Take by mouth once a week.   pravastatin (PRAVACHOL) 20 MG tablet TAKE 1 TABLET(20 MG) BY MOUTH DAILY   No facility-administered encounter medications on file as of 08/02/2022.     PAST MEDICAL HISTORY: Past Medical History:  Diagnosis Date   GERD (gastroesophageal reflux disease)    H/O necrotizing fasciitis 2009   right upper thigh   Hyperlipidemia    lower with diet changes   Wears glasses    Wears partial dentures     PAST SURGICAL HISTORY: Past Surgical History:  Procedure Laterality Date   COLONOSCOPY     declines, will refer for Cologuard 09/2015   LEG SURGERY  2009   right upper leg,necrotizing fascitis   NASAL SINUS SURGERY     age 14yo    ALLERGIES: Allergies  Allergen Reactions   Tessalon [Benzonatate]    Penicillins Rash    FAMILY HISTORY: Family History  Problem Relation Age of Onset   Stroke Mother    Dementia Mother        vascular   Cancer Father 37       died of bladder cancer   Osteoporosis Sister    Diabetes Neg Hx    Heart disease Neg Hx    Hypertension Neg Hx    Colon cancer Neg Hx     SOCIAL HISTORY: Social History   Tobacco Use   Smoking status: Former    Packs/day: 0.50    Years: 15.00    Total pack years: 7.50    Types: Cigarettes    Start date: 06/20/2004   Smokeless tobacco: Never  Vaping Use   Vaping Use: Never used  Substance Use Topics  Alcohol use: No    Alcohol/week: 0.0 standard drinks of alcohol   Drug use: No   Social History   Social History Narrative   Married, 2 children from ex wife,    exercise - 5 days per week, 40-50 minutes, kung fu, weight training.  Walk nightly.      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 No distress. Cooperative with exam.  Skin: No obvious rash or jaundice. HEENT: Atraumatic. Anicteric. Lungs: Non-labored breathing on room air  Extremities: No edema. Arthritic changes in bilateral hands. Psych: Affect  appropriate.  Neurological: Mental Status: Alert. Speech fluent. No pseudobulbar affect Cranial Nerves: CNII: No RAPD. Visual fields grossly intact. CNIII, IV, VI: PERRL. No nystagmus. EOMI. CN V: Facial sensation intact bilaterally to fine touch. CN VII: Facial muscles symmetric and strong. No ptosis at rest. CN VIII: Hearing grossly intact bilaterally. CN IX: No hypophonia. CN X: Palate elevates symmetrically. CN XI: Full strength shoulder shrug bilaterally. CN XII: Tongue protrusion full and midline. No atrophy or fasciculations. No significant dysarthria Motor: Tone is normal. No atrophy.  Individual muscle group testing (MRC grade out of 5):  Movement     Neck flexion 5    Neck extension 5     Right Left   Shoulder abduction 5 5   Elbow flexion 5 5   Elbow extension 5 5   Finger abduction - FDI 5 5   Finger abduction - ADM 5 5   Finger extension 5 5   Finger distal flexion - 2/3 5 5    Finger distal flexion - 4/5 5 5    Thumb flexion - FPL 5 5   Thumb abduction - APB 5 5    Hip flexion 5 5   Knee extension 5 5   Knee flexion 5 5   Dorsiflexion 5 5   Plantarflexion 5 5    Reflexes:  Right Left   Bicep 2+ 2+   Tricep 2+ 2+   BrRad 2+ 2+   Knee 2+ 2+ Cross adductors bilaterally  Ankle 2+ 2+    Pathological Reflexes: Hoffman: absent bilaterally Troemner: absent bilaterally Sensation: Pinprick: Intact in all extremities Coordination: Intact finger-to- nose-finger bilaterally. Romberg negative. Gait: Able to rise from chair with arms crossed unassisted. Normal, narrow-based gait.  Lab and Test Review: Internal labs: 10/17/21: HbA1c: 5.5 Vit D: 37.94 TSH: 1.78 CMP wnl  External cervical xray print out brought by patient (05/29/22): Degenerative changes per my read   ASSESSMENT: Edahi Kroening is a 66 y.o. male who presents for evaluation of bilateral hand numbness and tingling. He has a relevant medical history of HLD, GERD, OA, former smoker. His  neurological examination is essentially normal today. Available diagnostic data is significant for cervical xray showing degenerative changes. Patient's symptoms are most consistent with bilateral carpal tunnel syndrome. Cervical radiculopathy is also possible given neck pain and arthritis seen on cervical xray. I am recommending wrist splints and will get an EMG to further clarify symptoms.  PLAN: -EMG bilateral CTS protocol -Wrist splints bilaterally  -Return to clinic to be determined  The impression above as well as the plan as outlined below were extensively discussed with the patient  who voiced understanding. All questions were answered to their satisfaction.  When available, results of the above investigations and possible further recommendations will be communicated to the patient via telephone/MyChart. Patient to call office if not contacted after expected testing turnaround time.   Total time spent reviewing records, interview, history/exam, documentation, and coordination of care on day of encounter:  30 min   Thank you for allowing me to participate in patient's care.  If I can answer any additional questions, I would be pleased to do so.  Jacquelyne Balint, MD   CC: Janeece Agee, NP [address] [place] [address]  CC: Referring provider: Melina Fiddler, MD [address] [place],  [place] [address]

## 2022-08-02 ENCOUNTER — Encounter: Payer: Self-pay | Admitting: Neurology

## 2022-08-02 ENCOUNTER — Ambulatory Visit: Payer: Medicare Other | Admitting: Neurology

## 2022-08-02 VITALS — BP 164/69 | HR 76 | Ht 67.0 in | Wt 188.8 lb

## 2022-08-02 DIAGNOSIS — M542 Cervicalgia: Secondary | ICD-10-CM

## 2022-08-02 DIAGNOSIS — G5603 Carpal tunnel syndrome, bilateral upper limbs: Secondary | ICD-10-CM

## 2022-08-02 DIAGNOSIS — R200 Anesthesia of skin: Secondary | ICD-10-CM | POA: Diagnosis not present

## 2022-08-02 DIAGNOSIS — R202 Paresthesia of skin: Secondary | ICD-10-CM

## 2022-08-02 DIAGNOSIS — M1990 Unspecified osteoarthritis, unspecified site: Secondary | ICD-10-CM | POA: Diagnosis not present

## 2022-08-02 NOTE — Patient Instructions (Addendum)
Cock up wrist splint for carpal tunnel symptoms can be bought in local drug stores or online. This should especially be worn at night while sleeping.     We will get a muscle and nerve test called an EMG to make sure this is carpal tunnel and not coming from your neck (see more information below).  We will discuss next steps after your EMG.  Please let me know if you have any questions or concerns in the meantime.  The physicians and staff at [facility] are committed to providing excellent care. You may receive a survey requesting feedback about your experience at our office. We strive to receive "very good" responses to the survey questions. If you feel that your experience would prevent you from giving the office a "very good " response, please contact our office to try to remedy the situation. We may be reached at [phone]. Thank you for taking the time out of your busy day to complete the survey.  Kai Levins, MD [facility]  ELECTROMYOGRAM AND NERVE CONDUCTION STUDIES (EMG/NCS) INSTRUCTIONS  How to Prepare The neurologist conducting the EMG will need to know if you have certain medical conditions. Tell the neurologist and other EMG lab personnel if you: Have a pacemaker or any other electrical medical device Take blood-thinning medications Have hemophilia, a blood-clotting disorder that causes prolonged bleeding Bathing Take a shower or bath shortly before your exam in order to remove oils from your skin. Don't apply lotions or creams before the exam.  What to Expect You'll likely be asked to change into a hospital gown for the procedure and lie down on an examination table. The following explanations can help you understand what will happen during the exam.  Electrodes. The neurologist or a technician places surface electrodes at various locations on your skin depending on where you're experiencing symptoms. Or the neurologist may insert needle electrodes at different  sites depending on your symptoms.  Sensations. The electrodes will at times transmit a tiny electrical current that you may feel as a twinge or spasm. The needle electrode may cause discomfort or pain that usually ends shortly after the needle is removed. If you are concerned about discomfort or pain, you may want to talk to the neurologist about taking a short break during the exam.  Instructions. During the needle EMG, the neurologist will assess whether there is any spontaneous electrical activity when the muscle is at rest - activity that isn't present in healthy muscle tissue - and the degree of activity when you slightly contract the muscle.  He or she will give you instructions on resting and contracting a muscle at appropriate times. Depending on what muscles and nerves the neurologist is examining, he or she may ask you to change positions during the exam.  After your EMG You may experience some temporary, minor bruising where the needle electrode was inserted into your muscle. This bruising should fade within several days. If it persists, contact your primary care doctor.

## 2022-09-17 ENCOUNTER — Encounter: Payer: Medicare Other | Admitting: Neurology

## 2022-10-02 ENCOUNTER — Ambulatory Visit: Payer: Medicare Other | Admitting: Neurology

## 2022-10-02 ENCOUNTER — Telehealth: Payer: Self-pay | Admitting: Neurology

## 2022-10-02 ENCOUNTER — Other Ambulatory Visit: Payer: Self-pay

## 2022-10-02 DIAGNOSIS — G5603 Carpal tunnel syndrome, bilateral upper limbs: Secondary | ICD-10-CM

## 2022-10-02 DIAGNOSIS — R200 Anesthesia of skin: Secondary | ICD-10-CM

## 2022-10-02 DIAGNOSIS — R202 Paresthesia of skin: Secondary | ICD-10-CM

## 2022-10-02 DIAGNOSIS — M5412 Radiculopathy, cervical region: Secondary | ICD-10-CM

## 2022-10-02 NOTE — Procedures (Signed)
[facility]  [place], [place]  [place], [place] [address] Tel: [phone] Fax: [phone] Test Date:  10/02/2022  Patient: Jared Coffey DOB: 1956-02-20 Physician: Kai Levins, MD  Sex: Male Height: '5\' 7"'$  Ref Phys: Kai Levins, MD  ID#: [id number]   Technician:    History: This is a 67 year old male with numbness and tingling in his hands.  NCV & EMG Findings: Extensive electrodiagnostic evaluation of bilateral upper limbs shows: Right median sensory response is absent. Left median-ulnar palmar sensory comparison studies shows an abnormal peak latency difference (0.55 ms). Left median, bilateral ulnar, and bilateral radial sensory responses are within normal limits. Right median (APB) motor response shows a prolonged distal onset latency (4.2 ms). Left median (APB) and bilateral ulnar (ADM) motor responses are within normal limits. Chronic motor axon loss changes without accompanying active denervation changes are seen in the right first dorsal interosseous, right extensor indicis proprius, and right flexor pollicis longus muscles. All other tested muscles are within normal limits with normal motor unit configuration and recruitment patterns.  Impression: This is an abnormal study. The findings are most consistent with the following: Bilateral median mononeuropathy at or distal to the wrist, consistent with carpal tunnel syndrome. Findings are severe in degree electrically on the right and very mild in degree electrically on the left. The residuals of an old intraspinal canal lesion (ie: motor radiculopathy) at the right C8 root, mild in degree electrically. No electrodiagnostic evidence of a left cervical (C5-C8) motor radiculopathy.    ___________________________ Kai Levins, MD    Nerve Conduction Studies Motor Nerve Results    Latency Amplitude F-Lat Segment Distance CV Comment  Site (ms) Norm (mV) Norm (ms)  (cm) (m/s) Norm   Left Median (APB)  Motor  Wrist 3.3  < 4.0 6.3  > 5.0        Elbow 8.3 - 5.5 -  Elbow-Wrist 30 60  > 50   Right Median (APB) Motor  Wrist *4.2  < 4.0 5.7  > 5.0        Elbow 9.6 - 4.9 -  Elbow-Wrist 29.5 55  > 50   Left Ulnar (ADM) Motor  Wrist 1.98  < 3.1 9.1  > 7.0        Bel elbow 5.8 - 8.3 -  Bel elbow-Wrist 22 58  > 50   Ab elbow 7.8 - 7.4 -  Ab elbow-Bel elbow 10 50 -   Right Ulnar (ADM) Motor  Wrist 2.1  < 3.1 7.0  > 7.0        Bel elbow 6.4 - 5.6 -  Bel elbow-Wrist 23 53  > 50   Ab elbow 8.4 - 5.1 -  Ab elbow-Bel elbow 10 50 -    Sensory Sites    Neg Peak Lat Amplitude (O-P) Segment Distance Velocity Comment  Site (ms) Norm (V) Norm  (cm) (ms)   Left Median Sensory  Wrist-Dig II 3.5  < 3.8 15  > 10 Wrist-Dig II 13    Right Median Sensory  Wrist-Dig II *NR  < 3.8 *NR  > 10 Wrist-Dig II 13    Left Median-Ulnar Palmar Sensory       Median  Palm-Wrist *2.3  < 2.2 63  > 10 Palm-Wrist 8         Ulnar  Palm-Wrist 1.75  < 2.2 12  > 5 Palm-Wrist 8    Left Radial Sensory  Forearm-Wrist 2.1  < 2.8 29  >  10 Forearm-Wrist 10    Right Radial Sensory  Forearm-Wrist 2.0  < 2.8 19  > 10 Forearm-Wrist 10    Left Ulnar Sensory  Wrist-Dig V 2.6  < 3.2 9  > 5 Wrist-Dig V 11    Right Ulnar Sensory  Wrist-Dig V 2.9  < 3.2 5  > 5 Wrist-Dig V 11     Inter-Nerve Comparisons   Nerve 1 Value 1 Nerve 2 Value 2 Parameter Result Normal  Sensory Sites  L Median Palm-Wrist 2.3 ms L Ulnar Palm-Wrist 1.75 ms Peak Lat Diff *0.55 ms <0.40   Electromyography   Side Muscle Ins.Act Fibs Fasc Recrt Amp Dur Poly Activation Comment  Right FDI Nml Nml Nml *2- *1+ *1+ *1+ Nml N/A  Right EIP Nml Nml Nml *1- *1+ *1+ Nml Nml N/A  Right FPL Nml Nml Nml *1- *1+ *1+ Nml Nml N/A  Right APB Nml Nml Nml Nml Nml Nml Nml Nml N/A  Right Pronator teres Nml Nml Nml Nml Nml Nml Nml Nml N/A  Left C7 PSP Nml Nml Nml Nml Nml Nml Nml Nml N/A  Right Biceps Nml Nml Nml Nml Nml Nml Nml Nml N/A  Right Triceps Nml Nml Nml Nml Nml Nml Nml Nml  N/A  Right Deltoid Nml Nml Nml Nml Nml Nml Nml Nml N/A  Right C7 PSP Nml Nml Nml Nml Nml Nml Nml Nml N/A  Left FDI Nml Nml Nml Nml Nml Nml Nml Nml N/A  Left EIP Nml Nml Nml Nml Nml Nml Nml Nml N/A  Left Pronator teres Nml Nml Nml Nml Nml Nml Nml Nml N/A  Left Biceps Nml Nml Nml Nml Nml Nml Nml Nml N/A  Left Triceps Nml Nml Nml Nml Nml Nml Nml Nml N/A  Left Deltoid Nml Nml Nml Nml Nml Nml Nml Nml N/A      Waveforms:  Motor           Sensory

## 2022-10-02 NOTE — Telephone Encounter (Signed)
Discussed the results of patient's EMG after the procedure today. 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  Patient's symptoms have not significantly improved with splinting, so he would like to be referred to hand surgeon to discuss potential surgical options. This is be placed for the patient.  All questions were answered.  Kai Levins, MD [facility]

## 2022-10-03 ENCOUNTER — Telehealth: Payer: Self-pay

## 2022-10-03 NOTE — Telephone Encounter (Signed)
-----   Message from Shellia Carwin, MD sent at 10/02/2022 12:17 PM EST ----- Regarding: Hand surgeon referral [place],  Can you order a hand surgeon referral for bilateral carpal tunnel syndrome? We should send a copy of my EMG with the referral if we are faxing it.  Thank you,  Kai Levins, MD

## 2022-10-03 NOTE — Telephone Encounter (Signed)
Already Fax to [place].with EMG

## 2022-11-07 ENCOUNTER — Ambulatory Visit (INDEPENDENT_AMBULATORY_CARE_PROVIDER_SITE_OTHER): Payer: Medicare Other | Admitting: Family Medicine

## 2022-11-07 ENCOUNTER — Encounter: Payer: Self-pay | Admitting: Family Medicine

## 2022-11-07 ENCOUNTER — Other Ambulatory Visit: Payer: Medicare Other

## 2022-11-07 VITALS — BP 138/64 | HR 63 | Temp 98.3°F | Ht 67.0 in | Wt 187.1 lb

## 2022-11-07 DIAGNOSIS — K219 Gastro-esophageal reflux disease without esophagitis: Secondary | ICD-10-CM | POA: Diagnosis not present

## 2022-11-07 DIAGNOSIS — Z125 Encounter for screening for malignant neoplasm of prostate: Secondary | ICD-10-CM | POA: Diagnosis not present

## 2022-11-07 DIAGNOSIS — Z130 Encounter for screening for diseases of the blood and blood-forming organs and certain disorders involving the immune mechanism: Secondary | ICD-10-CM

## 2022-11-07 DIAGNOSIS — Z7689 Persons encountering health services in other specified circumstances: Secondary | ICD-10-CM

## 2022-11-07 DIAGNOSIS — E559 Vitamin D deficiency, unspecified: Secondary | ICD-10-CM

## 2022-11-07 DIAGNOSIS — Z1329 Encounter for screening for other suspected endocrine disorder: Secondary | ICD-10-CM

## 2022-11-07 DIAGNOSIS — Z1211 Encounter for screening for malignant neoplasm of colon: Secondary | ICD-10-CM

## 2022-11-07 DIAGNOSIS — E785 Hyperlipidemia, unspecified: Secondary | ICD-10-CM

## 2022-11-07 DIAGNOSIS — Z13228 Encounter for screening for other metabolic disorders: Secondary | ICD-10-CM

## 2022-11-07 NOTE — Progress Notes (Unsigned)
New Patient Office Visit  Subjective    Patient ID: Jared Coffey, male    DOB: June 09, 1956  Age: 67 y.o. MRN: [id number]  CC:  Chief Complaint  Patient presents with   Establish Care    Pt is Est Care. Pt is not FASTING. Pt would like to come back for fasting labs. Pt would like a endoscopy and colonoscopy.     HPI Jared Coffey presents to establish care with new provider.   Outpatient Encounter Medications as of 11/07/2022  Medication Sig   Omeprazole Magnesium (PRILOSEC PO) Take by mouth once a week.   pravastatin (PRAVACHOL) 20 MG tablet TAKE 1 TABLET(20 MG) BY MOUTH DAILY   fluticasone (CUTIVATE) 0.005 % ointment Apply 1 Application topically daily. (Patient not taking: Reported on 11/07/2022)   No facility-administered encounter medications on file as of 11/07/2022.    Past Medical History:  Diagnosis Date   GERD (gastroesophageal reflux disease)    H/O necrotizing fasciitis 2009   right upper thigh   Hyperlipidemia    lower with diet changes   Wears glasses    Wears partial dentures     Past Surgical History:  Procedure Laterality Date   COLONOSCOPY     declines, will refer for Cologuard 09/2015   LEG SURGERY  2009   right upper leg,necrotizing fascitis   NASAL SINUS SURGERY     age 89yo    Family History  Problem Relation Age of Onset   Stroke Mother    Dementia Mother        vascular   Cancer Father 30       died of bladder cancer   Osteoporosis Sister    Diabetes Neg Hx    Heart disease Neg Hx    Hypertension Neg Hx    Colon cancer Neg Hx     Social History   Socioeconomic History   Marital status: Married    Spouse name: Not on file   Number of children: Not on file   Years of education: Not on file   Highest education level: Not on file  Occupational History   Occupation: [employer]  Tobacco Use   Smoking status: Former    Packs/day: 0.50    Years: 15.00    Additional pack years: 0.00    Total pack years: 7.50    Types:  Cigarettes    Start date: 06/20/2004   Smokeless tobacco: Never  Vaping Use   Vaping Use: Never used  Substance and Sexual Activity   Alcohol use: No    Alcohol/week: 0.0 standard drinks of alcohol   Drug use: No   Sexual activity: Not Currently  Other Topics Concern   Not on file  Social History Narrative   Married, 2 children from ex wife,    exercise - 5 days per week, 40-50 minutes, kung fu, weight training.  Walk nightly.      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[employer]   Financial Resource Strain: Not on file  Food Insecurity: No Food Insecurity (11/07/2022)   Hunger Vital Sign    Worried About Running Out of Food in the Last Year: Never true    Ran Out of Food in the Last Year: Never true  Transportation Needs: No Transportation Needs (11/07/2022)   PRAPARE - Administrator, Civil Service (Medical): No    Lack of Transportation (Non-Medical): No  Physical Activity: Not  on file  Stress: Not on file  Social Connections: Moderately Isolated (11/07/2022)   Social Connection and Isolation Panel [NHANES]    Frequency of Communication with Friends and Family: Three times a week    Frequency of Social Gatherings with Friends and Family: Twice a week    Attends Religious Services: Never    Database administrator or Organizations: No    Attends Banker Meetings: Never    Marital Status: Married  Catering manager Violence: Not At Risk (11/07/2022)   Humiliation, Afraid, Rape, and Kick questionnaire    Fear of Current or Ex-Partner: No    Emotionally Abused: No    Physically Abused: No    Sexually Abused: No    ROS See HPI above    Objective    BP 138/64   Pulse 63   Temp 98.3 F (36.8 C)   Ht 5\' 7"  (1.702 m)   Wt 187 lb 2 oz (84.9 kg)   SpO2 98%   BMI 29.31 kg/m   Physical Exam    Assessment & Plan:  Encounter to establish care  1.Review health maintenance:  -Needs AWV  appointment -Recommend to get PNA vaccine at your local pharmacy;  -Covid booster: [facility] at [place]  -Had RSV: [facility] as [place]  -Referral placed to GI for colonoscopy.  No follow-ups on file.   Zandra Abts, NP

## 2022-11-07 NOTE — Patient Instructions (Addendum)
It was a pleasure to meet you and I look forward to taking care of you. -Ordered labs, will need to be fasting. Office will call with lab results and you may see them on MyChart.  -Schedule AWV visit.  -Continue with medications.

## 2022-11-08 ENCOUNTER — Ambulatory Visit (INDEPENDENT_AMBULATORY_CARE_PROVIDER_SITE_OTHER): Payer: Medicare Other | Admitting: Family Medicine

## 2022-11-08 ENCOUNTER — Encounter: Payer: Self-pay | Admitting: Family Medicine

## 2022-11-08 ENCOUNTER — Other Ambulatory Visit (INDEPENDENT_AMBULATORY_CARE_PROVIDER_SITE_OTHER): Payer: Medicare Other

## 2022-11-08 VITALS — BP 122/68 | HR 66 | Temp 98.1°F | Ht 67.0 in | Wt 187.1 lb

## 2022-11-08 DIAGNOSIS — Z130 Encounter for screening for diseases of the blood and blood-forming organs and certain disorders involving the immune mechanism: Secondary | ICD-10-CM | POA: Diagnosis not present

## 2022-11-08 DIAGNOSIS — Z1329 Encounter for screening for other suspected endocrine disorder: Secondary | ICD-10-CM | POA: Diagnosis not present

## 2022-11-08 DIAGNOSIS — Z0000 Encounter for general adult medical examination without abnormal findings: Secondary | ICD-10-CM

## 2022-11-08 DIAGNOSIS — H6122 Impacted cerumen, left ear: Secondary | ICD-10-CM | POA: Diagnosis not present

## 2022-11-08 DIAGNOSIS — E559 Vitamin D deficiency, unspecified: Secondary | ICD-10-CM

## 2022-11-08 DIAGNOSIS — Z0001 Encounter for general adult medical examination with abnormal findings: Secondary | ICD-10-CM

## 2022-11-08 DIAGNOSIS — E785 Hyperlipidemia, unspecified: Secondary | ICD-10-CM | POA: Diagnosis not present

## 2022-11-08 DIAGNOSIS — Z125 Encounter for screening for malignant neoplasm of prostate: Secondary | ICD-10-CM | POA: Diagnosis not present

## 2022-11-08 DIAGNOSIS — Z13228 Encounter for screening for other metabolic disorders: Secondary | ICD-10-CM | POA: Diagnosis not present

## 2022-11-08 LAB — LIPID PANEL
Cholesterol: 184 mg/dL (ref 0–200)
HDL: 61.7 mg/dL (ref >39.00)
LDL Cholesterol: 103 mg/dL — ABNORMAL HIGH (ref 0–99)
NonHDL: 122.62
Total CHOL/HDL Ratio: 3
Triglycerides: 100 mg/dL (ref 0.0–149.0)
VLDL: 20 mg/dL (ref 0.0–40.0)

## 2022-11-08 LAB — COMPREHENSIVE METABOLIC PANEL
ALT: 22 U/L (ref 0–53)
AST: 20 U/L (ref 0–37)
Albumin: 4.3 g/dL (ref 3.5–5.2)
Alkaline Phosphatase: 67 U/L (ref 39–117)
BUN: 17 mg/dL (ref 6–23)
CO2: 28 mEq/L (ref 19–32)
Calcium: 9.5 mg/dL (ref 8.4–10.5)
Chloride: 105 mEq/L (ref 96–112)
Creatinine, Ser: 1.1 mg/dL (ref 0.40–1.50)
GFR: 69.2 mL/min (ref >60.00)
Glucose, Bld: 93 mg/dL (ref 70–99)
Potassium: 4.5 mEq/L (ref 3.5–5.1)
Sodium: 141 mEq/L (ref 135–145)
Total Bilirubin: 0.6 mg/dL (ref 0.2–1.2)
Total Protein: 6.5 g/dL (ref 6.0–8.3)

## 2022-11-08 LAB — CBC WITH DIFFERENTIAL/PLATELET
Basophils Absolute: 0 10*3/uL (ref 0.0–0.1)
Basophils Relative: 0.3 % (ref 0.0–3.0)
Eosinophils Absolute: 0.2 10*3/uL (ref 0.0–0.7)
Eosinophils Relative: 4.2 % (ref 0.0–5.0)
HCT: 44 % (ref 39.0–52.0)
Hemoglobin: 15.2 g/dL (ref 13.0–17.0)
Lymphocytes Relative: 33 % (ref 12.0–46.0)
Lymphs Abs: 1.9 10*3/uL (ref 0.7–4.0)
MCHC: 34.6 g/dL (ref 30.0–36.0)
MCV: 88.4 fl (ref 78.0–100.0)
Monocytes Absolute: 0.6 10*3/uL (ref 0.1–1.0)
Monocytes Relative: 9.8 % (ref 3.0–12.0)
Neutro Abs: 3.1 10*3/uL (ref 1.4–7.7)
Neutrophils Relative %: 52.7 % (ref 43.0–77.0)
Platelets: 286 10*3/uL (ref 150.0–400.0)
RBC: 4.98 Mil/uL (ref 4.22–5.81)
RDW: 12.9 % (ref 11.5–15.5)
WBC: 5.9 10*3/uL (ref 4.0–10.5)

## 2022-11-08 LAB — TSH: TSH: 2.56 u[IU]/mL (ref 0.35–5.50)

## 2022-11-08 LAB — PSA: PSA: 0.63 ng/mL (ref 0.10–4.00)

## 2022-11-08 LAB — VITAMIN D 25 HYDROXY (VIT D DEFICIENCY, FRACTURES): VITD: 34.94 ng/mL (ref 30.00–100.00)

## 2022-11-08 NOTE — Assessment & Plan Note (Signed)
Continue with Pravastatin. Patient is not fasting today and will come back tomorrow for lipid panel when fasting.

## 2022-11-08 NOTE — Assessment & Plan Note (Signed)
Continue with Omeprazole on weekly basis since it is effective.

## 2022-11-08 NOTE — Patient Instructions (Signed)
-  AWV completed today. -Left ear lavage completed today. -Office will call with lab results that were drawn earlier today when fasting and you may see those on MyChart.

## 2022-11-08 NOTE — Progress Notes (Signed)
Subjective:   Jared Coffey is a 67 y.o. male who presents for a Welcome to Medicare exam.   Review of Systems: No concerns Cardiac Risk Factors include: dyslipidemia;advanced age (>46men, >8 women);male gender     Objective:    Today's Vitals   11/08/22 1010  BP: 122/68  Pulse: 66  Temp: 98.1 F (36.7 C)  SpO2: 98%  Weight: 187 lb 2 oz (84.9 kg)  Height: 5\' 7"  (1.702 m)   Body mass index is 29.31 kg/m.  Medications Outpatient Encounter Medications as of 11/08/2022  Medication Sig   Omeprazole Magnesium (PRILOSEC PO) Take by mouth once a week.   pravastatin (PRAVACHOL) 20 MG tablet TAKE 1 TABLET(20 MG) BY MOUTH DAILY   No facility-administered encounter medications on file as of 11/08/2022.    History: Past Medical History:  Diagnosis Date   GERD (gastroesophageal reflux disease)    H/O necrotizing fasciitis 2009   right upper thigh   Hyperlipidemia    lower with diet changes   Wears glasses    Wears partial dentures    Past Surgical History:  Procedure Laterality Date   COLONOSCOPY     declines, will refer for Cologuard 09/2015   LEG SURGERY  2009   right upper leg,necrotizing fascitis   NASAL SINUS SURGERY     age 63yo    Family History  Problem Relation Age of Onset   Stroke Mother    Dementia Mother        vascular   Cancer Father 74       died of bladder cancer   Osteoporosis Sister    Stevens-Johnson syndrome Sister    Diabetes Neg Hx    Heart disease Neg Hx    Hypertension Neg Hx    Colon cancer Neg Hx    Social History   Occupational History   Occupation: Tour Press photographer  Tobacco Use   Smoking status: Former    Packs/day: 0.50    Years: 15.00    Additional pack years: 0.00    Total pack years: 7.50    Types: Cigarettes    Start date: 06/20/2004   Smokeless tobacco: Never  Vaping Use   Vaping Use: Never used  Substance and Sexual Activity   Alcohol use: No    Alcohol/week: 0.0 standard drinks of alcohol   Drug use: No   Sexual  activity: Not Currently   Tobacco Counseling Counseling given: Not Answered  Immunizations and Health Maintenance Immunization History  Administered Date(s) Administered   Hepatitis B 10/28/2012, 11/27/2012   Hepatitis B, ADULT 04/30/2013   Influenza,inj,Quad PF,6+ Mos 05/25/2014, 08/30/2016   Influenza-Unspecified 04/16/2022   PFIZER Comirnaty(Gray Top)Covid-19 Tri-Sucrose Vaccine 10/23/2019, 11/16/2019, 05/26/2020   PPD Test 10/13/2012, 10/28/2012, 06/28/2014   Pfizer Covid-19 Vaccine Bivalent Booster 46yrs & up 10/05/2021, 01/15/2022   RSV,unspecified 07/31/2022   Respiratory Syncytial Virus Vaccine,Recomb Aduvanted(Arexvy) 07/31/2022   Tdap 10/13/2012, 10/05/2021   Unspecified SARS-COV-2 Vaccination 04/16/2022   Zoster Recombinat (Shingrix) 11/16/2016, 03/06/2017   Health Maintenance Due  Topic Date Due   Pneumonia Vaccine 52+ Years old (1 of 1 - PCV) Never done   COVID-19 Vaccine (7 - 2023-24 season) 06/11/2022   Fecal DNA (Cologuard)  10/19/2022    Activities of Daily Living    11/08/2022   10:30 AM  In your present state of health, do you have any difficulty performing the following activities:  Hearing? 0  Vision? 0  Difficulty concentrating or making decisions? 0  Walking or climbing stairs?  0  Dressing or bathing? 0  Doing errands, shopping? 0  Preparing Food and eating ? N  Using the Toilet? N  In the past six months, have you accidently leaked urine? N  Do you have problems with loss of bowel control? N  Managing your Medications? N  Managing your Finances? N  Housekeeping or managing your Housekeeping? N   Physical Exam Vitals reviewed.  Constitutional:      General: He is not in acute distress.    Appearance: Normal appearance. He is not ill-appearing or toxic-appearing.  HENT:     Head: Normocephalic and atraumatic.     Right Ear: Tympanic membrane, ear canal and external ear normal. There is no impacted cerumen.     Left Ear: External ear normal.  There is impacted cerumen.     Nose:     Right Sinus: No maxillary sinus tenderness or frontal sinus tenderness.     Left Sinus: No maxillary sinus tenderness or frontal sinus tenderness.     Mouth/Throat:     Mouth: Mucous membranes are moist.     Pharynx: Oropharynx is clear. No oropharyngeal exudate or posterior oropharyngeal erythema.  Eyes:     General:        Right eye: No discharge.        Left eye: No discharge.     Conjunctiva/sclera: Conjunctivae normal.     Pupils: Pupils are equal, round, and reactive to light.  Neck:     Thyroid: No thyromegaly.  Cardiovascular:     Rate and Rhythm: Normal rate and regular rhythm.     Pulses:          Dorsalis pedis pulses are 3+ on the right side and 3+ on the left side.     Heart sounds: Normal heart sounds. No murmur heard.    No friction rub. No gallop.  Pulmonary:     Effort: Pulmonary effort is normal. No respiratory distress.     Breath sounds: Normal breath sounds.  Abdominal:     General: Abdomen is flat. Bowel sounds are normal.     Palpations: Abdomen is soft.     Tenderness: There is no right CVA tenderness or left CVA tenderness.  Musculoskeletal:        General: No tenderness (Cervical, thoracic, and lumbar spine.). Normal range of motion.     Cervical back: Normal range of motion.     Right lower leg: No edema.     Left lower leg: No edema.  Lymphadenopathy:     Head:     Right side of head: No submental or submandibular adenopathy.     Left side of head: No submental or submandibular adenopathy.     Cervical: No cervical adenopathy.  Skin:    General: Skin is warm and dry.  Neurological:     General: No focal deficit present.     Mental Status: He is alert and oriented to person, place, and time. Mental status is at baseline.     Motor: No weakness.     Gait: Gait normal.  Psychiatric:        Mood and Affect: Mood normal.        Behavior: Behavior normal.        Thought Content: Thought content normal.         Judgment: Judgment normal.   Advanced Directives: Does Patient Have a Medical Advance Directive?: Yes Does patient want to make changes to medical advance directive?: Yes (MAU/Ambulatory/Procedural Areas -  Information given) (He is unsure if he has this, but will take the information to look through with comparison to what he already has.) Copy of Healthcare Power of Attorney in Chart?: No - copy requested (Request a copy of what patient has at home.)    Assessment:    This is a routine wellness  examination for this patient .   Vision/Hearing screen Hearing Screening   5000Hz   Right ear YES  Left ear YES   Vision Screening   Right eye Left eye Both eyes  Without correction 20/30 1 20/20 1 20/25  With correction       Dietary issues and exercise activities discussed:  Current Exercise Habits: Home exercise routine, Type of exercise: Other - see comments;yoga, Time (Minutes): 45, Frequency (Times/Week): 3, Weekly Exercise (Minutes/Week): 135, Intensity: Intense   Goals      Client will report no fall or injuries in the next 12 months.        Depression Screen    11/07/2022    2:36 PM 10/17/2021   10:01 AM 10/05/2021   11:59 AM 09/30/2019    8:39 AM  PHQ 2/9 Scores  PHQ - 2 Score 0 0 0 0  PHQ- 9 Score 0 0 0 0     Fall Risk    11/08/2022   10:29 AM  Fall Risk   Falls in the past year? 0  Number falls in past yr: 0  Injury with Fall? 0  Risk for fall due to : No Fall Risks    Cognitive Function    11/08/2022   10:32 AM  MMSE - Mini Mental State Exam  Orientation to time 4  Orientation to Place 4  Registration 3  Attention/ Calculation 5  Recall 2  Language- name 2 objects 2  Language- repeat 1  Language- follow 3 step command 3  Language- read & follow direction 1  Write a sentence 1  Copy design 1  Total score 27       Patient Care Team: Alveria Apley, NP as PCP - General (Family Medicine) Karie Soda, MD as Consulting Physician (General  Surgery) Wynona Canes, MD as Referring Physician (Specialist) Antony Madura, MD as Consulting Physician (Neurology)   PRE-PROCEDURE EXAM: Left TM cannot be visualized due to total occlusion/impaction of the ear canal. PROCEDURE INDICATION: Remove wax to visualize ear drum & relieve discomfort CONSENT:  Verbal PROCEDURE NOTE: Left ear: The CMA, Tonya Hutchins, irrigated both ears with warm water and ear drops to remove the wax. 100% of the wax was removed.  POST- PROCEDURE EXAM: TMs successfully visualized. TM with no erythema. The patient tolerated the procedure well.   Plan:    I have personally reviewed and noted the following in the patient's chart:   Medical and social history Use of alcohol, tobacco or illicit drugs  Current medications and supplements Functional ability and status Nutritional status Physical activity Advanced directives List of other physicians Hospitalizations, surgeries, and ER visits in previous 12 months Vitals Screenings to include cognitive, depression, and falls Referrals and appointments  In addition, I have reviewed and discussed with patient certain preventive protocols, quality metrics, and best practice recommendations. A written personalized care plan for preventive services as well as general preventive health recommendations were provided to patient.    Zandra Abts, NP 11/08/2022

## 2022-11-09 ENCOUNTER — Other Ambulatory Visit: Payer: Self-pay

## 2022-11-09 DIAGNOSIS — E785 Hyperlipidemia, unspecified: Secondary | ICD-10-CM

## 2022-11-09 MED ORDER — PRAVASTATIN SODIUM 40 MG PO TABS
40.0000 mg | ORAL_TABLET | Freq: Every evening | ORAL | 1 refills | Status: DC
Start: 2022-11-09 — End: 2023-05-09

## 2022-11-19 ENCOUNTER — Encounter: Payer: Self-pay | Admitting: Internal Medicine

## 2023-01-28 ENCOUNTER — Encounter: Payer: Self-pay | Admitting: Internal Medicine

## 2023-01-28 ENCOUNTER — Ambulatory Visit (AMBULATORY_SURGERY_CENTER): Payer: Medicare Other

## 2023-01-28 VITALS — Ht 67.0 in | Wt 172.0 lb

## 2023-01-28 DIAGNOSIS — Z1211 Encounter for screening for malignant neoplasm of colon: Secondary | ICD-10-CM

## 2023-01-28 MED ORDER — NA SULFATE-K SULFATE-MG SULF 17.5-3.13-1.6 GM/177ML PO SOLN
1.0000 | Freq: Once | ORAL | 0 refills | Status: AC
Start: 2023-01-28 — End: 2023-01-28

## 2023-01-28 NOTE — Progress Notes (Signed)

## 2023-02-19 ENCOUNTER — Encounter: Payer: Self-pay | Admitting: Internal Medicine

## 2023-02-19 ENCOUNTER — Ambulatory Visit: Payer: Medicare Other | Admitting: Internal Medicine

## 2023-02-19 VITALS — BP 142/83 | HR 55 | Temp 97.4°F | Resp 10 | Ht 67.0 in | Wt 172.0 lb

## 2023-02-19 DIAGNOSIS — D122 Benign neoplasm of ascending colon: Secondary | ICD-10-CM

## 2023-02-19 DIAGNOSIS — Z1211 Encounter for screening for malignant neoplasm of colon: Secondary | ICD-10-CM

## 2023-02-19 DIAGNOSIS — D123 Benign neoplasm of transverse colon: Secondary | ICD-10-CM | POA: Diagnosis not present

## 2023-02-19 DIAGNOSIS — K635 Polyp of colon: Secondary | ICD-10-CM | POA: Diagnosis not present

## 2023-02-19 DIAGNOSIS — D125 Benign neoplasm of sigmoid colon: Secondary | ICD-10-CM

## 2023-02-19 MED ORDER — SODIUM CHLORIDE 0.9 % IV SOLN
500.0000 mL | Freq: Once | INTRAVENOUS | Status: DC
Start: 2023-02-19 — End: 2023-02-19

## 2023-02-19 NOTE — Progress Notes (Signed)
Sedate, gd SR, tolerated procedure well, VSS, report to RN 

## 2023-02-19 NOTE — Progress Notes (Signed)
Called to room to assist during endoscopic procedure.  Patient ID and intended procedure confirmed with present staff. Received instructions for my participation in the procedure from the performing physician.  

## 2023-02-19 NOTE — Patient Instructions (Signed)
   Handouts on polyps & hemorrhoids given to you today  Await pathology results on polyps removed    YOU HAD AN ENDOSCOPIC PROCEDURE TODAY AT THE [facility]:   Refer to the procedure report that was given to you for any specific questions about what was found during the examination.  If the procedure report does not answer your questions, please call your gastroenterologist to clarify.  If you requested that your care partner not be given the details of your procedure findings, then the procedure report has been included in a sealed envelope for you to review at your convenience later.  YOU SHOULD EXPECT: Some feelings of bloating in the abdomen. Passage of more gas than usual.  Walking can help get rid of the air that was put into your GI tract during the procedure and reduce the bloating. If you had a lower endoscopy (such as a colonoscopy or flexible sigmoidoscopy) you may notice spotting of blood in your stool or on the toilet paper. If you underwent a bowel prep for your procedure, you may not have a normal bowel movement for a few days.  Please Note:  You might notice some irritation and congestion in your nose or some drainage.  This is from the oxygen used during your procedure.  There is no need for concern and it should clear up in a day or so.  SYMPTOMS TO REPORT IMMEDIATELY:  Following lower endoscopy (colonoscopy or flexible sigmoidoscopy):  Excessive amounts of blood in the stool  Significant tenderness or worsening of abdominal pains  Swelling of the abdomen that is new, acute  Fever of 100F or higher   For urgent or emergent issues, a gastroenterologist can be reached at any hour by calling [phone]. Do not use MyChart messaging for urgent concerns.    DIET:  We do recommend a small meal at first, but then you may proceed to your regular diet.  Drink plenty of fluids but you should avoid alcoholic beverages for 24 hours.  ACTIVITY:  You should plan to  take it easy for the rest of today and you should NOT DRIVE or use heavy machinery until tomorrow (because of the sedation medicines used during the test).    FOLLOW UP: Our staff will call the number listed on your records the next business day following your procedure.  We will call around 7:15- 8:00 am to check on you and address any questions or concerns that you may have regarding the information given to you following your procedure. If we do not reach you, we will leave a message.     If any biopsies were taken you will be contacted by phone or by letter within the next 1-3 weeks.  Please call us at [phone] if you have not heard about the biopsies in 3 weeks.    SIGNATURES/CONFIDENTIALITY: You and/or your care partner have signed paperwork which will be entered into your electronic medical record.  These signatures attest to the fact that that the information above on your After Visit Summary has been reviewed and is understood.  Full responsibility of the confidentiality of this discharge information lies with you and/or your care-partner. 

## 2023-02-19 NOTE — Op Note (Signed)
[facility] Patient Name: Jared Coffey Procedure Date: 02/19/2023 10:51 AM MRN: [id number] Endoscopist: Beverley Fiedler , MD, [id number] Age: 67 Referring MD:  Date of Birth: Mar 09, 1956 Gender: Male Account #: [id number] Procedure:                Colonoscopy Indications:              Screening for colorectal malignant neoplasm, This                            is the patient's first colonoscopy Medicines:                Monitored Anesthesia Care Procedure:                Pre-Anesthesia Assessment:                           - Prior to the procedure, a History and Physical                            was performed, and patient medications and                            allergies were reviewed. The patient's tolerance of                            previous anesthesia was also reviewed. The risks                            and benefits of the procedure and the sedation                            options and risks were discussed with the patient.                            All questions were answered, and informed consent                            was obtained. Prior Anticoagulants: The patient has                            taken no anticoagulant or antiplatelet agents. ASA                            Grade Assessment: II - A patient with mild systemic                            disease. After reviewing the risks and benefits,                            the patient was deemed in satisfactory condition to                            undergo the procedure.  After obtaining informed consent, the colonoscope                            was passed under direct vision. Throughout the                            procedure, the patient's blood pressure, pulse, and                            oxygen saturations were monitored continuously. The                            CF [id number] #[id number] was introduced through the anus                            and advanced to the  cecum, identified by                            appendiceal orifice and ileocecal valve. The                            colonoscopy was performed without difficulty. The                            patient tolerated the procedure well. The quality                            of the bowel preparation was excellent. The                            ileocecal valve, appendiceal orifice, and rectum                            were photographed. Scope In: 11:06:24 AM Scope Out: 11:28:16 AM Scope Withdrawal Time: 0 hours 17 minutes 53 seconds  Total Procedure Duration: 0 hours 21 minutes 52 seconds  Findings:                 The digital rectal exam was normal.                           A 20 mm polyp was found in the proximal ascending                            colon. The polyp was mucous-capped. The polyp was                            removed with a cold snare. Resection and retrieval                            were complete.                           A 4 mm polyp was found in the mid ascending colon.  The polyp was sessile. The polyp was removed with a                            cold snare. Resection and retrieval were complete.                           Five sessile polyps were found in the transverse                            colon. The polyps were 3 to 6 mm in size. These                            polyps were removed with a cold snare. Resection                            and retrieval were complete.                           Two sessile polyps were found in the sigmoid colon.                            The polyps were 3 to 5 mm in size. These polyps                            were removed with a cold snare. Resection and                            retrieval were complete.                           Internal hemorrhoids were found during                            retroflexion. The hemorrhoids were small. Complications:            No immediate  complications. Estimated Blood Loss:     Estimated blood loss was minimal. Impression:               - One 20 mm polyp in the proximal ascending colon,                            removed with a cold snare. Resected and retrieved.                           - One 4 mm polyp in the mid ascending colon,                            removed with a cold snare. Resected and retrieved.                           - Five 3 to 6 mm polyps in the transverse colon,  removed with a cold snare. Resected and retrieved.                           - Two 3 to 5 mm polyps in the sigmoid colon,                            removed with a cold snare. Resected and retrieved.                           - Small internal hemorrhoids. Recommendation:           - Patient has a contact number available for                            emergencies. The signs and symptoms of potential                            delayed complications were discussed with the                            patient. Return to normal activities tomorrow.                            Written discharge instructions were provided to the                            patient.                           - Resume previous diet.                           - Continue present medications.                           - Await pathology results.                           - Repeat colonoscopy is recommended for                            surveillance. The colonoscopy date will be                            determined after pathology results from today's                            exam become available for review. Beverley Fiedler, MD 02/19/2023 11:32:27 AM This report has been signed electronically.

## 2023-02-19 NOTE — Progress Notes (Signed)
GASTROENTEROLOGY PROCEDURE H&P NOTE   Primary Care Physician: Alveria Apley, NP    Reason for Procedure:   Colon cancer screening  Plan:    colonoscopy  Patient is appropriate for endoscopic procedure(s) in the ambulatory ([facility]) setting.  The nature of the procedure, as well as the risks, benefits, and alternatives were carefully and thoroughly reviewed with the patient. Ample time for discussion and questions allowed. The patient understood, was satisfied, and agreed to proceed.     HPI: Jared Coffey is a 67 y.o. male who presents for colonoscopy.  Medical history as below.  Tolerated the prep.  No recent chest pain or shortness of breath.  No abdominal pain today.  Past Medical History:  Diagnosis Date   GERD (gastroesophageal reflux disease)    H/O necrotizing fasciitis 2009   right upper thigh   Hyperlipidemia    lower with diet changes   Wears glasses    Wears partial dentures     Past Surgical History:  Procedure Laterality Date   COLONOSCOPY     declines, will refer for Cologuard 09/2015   LEG SURGERY  2009   right upper leg,necrotizing fascitis   NASAL SINUS SURGERY     age 9yo    Prior to Admission medications   Medication Sig Start Date End Date Taking? Authorizing Provider  Omeprazole Magnesium (PRILOSEC PO) Take by mouth once a week.   Yes [provider]  pravastatin (PRAVACHOL) 40 MG tablet Take 1 tablet (40 mg total) by mouth at bedtime. 11/09/22  Yes Alveria Apley, NP    Current Outpatient Medications  Medication Sig Dispense Refill   Omeprazole Magnesium (PRILOSEC PO) Take by mouth once a week.     pravastatin (PRAVACHOL) 40 MG tablet Take 1 tablet (40 mg total) by mouth at bedtime. 90 tablet 1   Current Facility-Administered Medications  Medication Dose Route Frequency Provider Last Rate Last Admin   0.9 %  sodium chloride infusion  500 mL Intravenous Once Donavon Kimrey, Carie Caddy, MD        Allergies as of 02/19/2023 -  Review Complete 02/19/2023  Allergen Reaction Noted   Penicillins Rash 06/20/2012    Family History  Problem Relation Age of Onset   Stroke Mother    Dementia Mother        vascular   Cancer Father 33       died of bladder cancer   Osteoporosis Sister    Stevens-Johnson syndrome Sister    Diabetes Neg Hx    Heart disease Neg Hx    Hypertension Neg Hx    Colon cancer Neg Hx    Colon polyps Neg Hx    Rectal cancer Neg Hx    Stomach cancer Neg Hx     Social History   Socioeconomic History   Marital status: Married    Spouse name: Not on file   Number of children: 2   Years of education: Not on file   Highest education level: Not on file  Occupational History   Occupation: Tour Bus  Tobacco Use   Smoking status: Former    Current packs/day: 0.50    Average packs/day: 0.5 packs/day for 18.7 years (9.3 ttl pk-yrs)    Types: Cigarettes    Start date: 06/20/2004   Smokeless tobacco: Never  Vaping Use   Vaping status: Never Used  Substance and Sexual Activity   Alcohol use: No    Alcohol/week: 0.0 standard drinks of alcohol   Drug use:  No   Sexual activity: Not Currently  Other Topics Concern   Not on file  Social History Narrative      Social Determinants of Health   Financial Resource Strain: Low Risk  (11/07/2022)   Overall Financial Resource Strain (CARDIA)    Difficulty of Paying Living Expenses: Not hard at all  Food Insecurity: No Food Insecurity (11/07/2022)   Hunger Vital Sign    Worried About Running Out of Food in the Last Year: Never true    Ran Out of Food in the Last Year: Never true  Transportation Needs: No Transportation Needs (11/07/2022)   PRAPARE - Administrator, Civil Service (Medical): No    Lack of Transportation (Non-Medical): No  Physical Activity: Sufficiently Active (11/07/2022)   Exercise Vital Sign    Days of Exercise per Week: 3 days    Minutes of Exercise per Session: 90 min  Stress: No Stress Concern Present  (11/07/2022)   [employer] of Occupational Health - Occupational Stress Questionnaire    Feeling of Stress : Only a little  Social Connections: Moderately Isolated (11/07/2022)   Social Connection and Isolation Panel [NHANES]    Frequency of Communication with Friends and Family: Three times a week    Frequency of Social Gatherings with Friends and Family: Twice a week    Attends Religious Services: Never    Database administrator or Organizations: No    Attends Banker Meetings: Never    Marital Status: Married  Catering manager Violence: Not At Risk (11/07/2022)   Humiliation, Afraid, Rape, and Kick questionnaire    Fear of Current or Ex-Partner: No    Emotionally Abused: No    Physically Abused: No    Sexually Abused: No    Physical Exam: Vital signs in last 24 hours: @BP  127/88   Pulse 63   Temp (!) 97.4 F (36.3 C) (Skin)   Ht 5\' 7"  (1.702 m)   Wt 172 lb (78 kg)   SpO2 98%   BMI 26.94 kg/m  GEN: NAD EYE: Sclerae anicteric ENT: MMM CV: Non-tachycardic Pulm: CTA b/l GI: Soft, NT/ND NEURO:  Alert & Oriented x 3   Erick Blinks, MD [facility]  02/19/2023 11:01 AM

## 2023-02-19 NOTE — Progress Notes (Signed)
Pt's states no medical or surgical changes since previsit or office visit. 

## 2023-02-20 ENCOUNTER — Telehealth: Payer: Self-pay

## 2023-02-20 NOTE — Telephone Encounter (Signed)
  Follow up Call-     02/19/2023   10:46 AM  Call back number  Post procedure Call Back phone  # [phone]  Permission to leave phone message Yes      Post op call attempted, no answer, left WM.

## 2023-02-21 ENCOUNTER — Encounter: Payer: Self-pay | Admitting: Internal Medicine

## 2023-03-08 ENCOUNTER — Telehealth: Payer: Self-pay | Admitting: Family Medicine

## 2023-03-08 NOTE — Telephone Encounter (Signed)
Pt has appt. 04/2023 for lab visit set up. Pt forgot about Pt is asking if we can do a lab to find out his blood type. Pt is going to continue the his medication unless Waymon Budge advise differently.

## 2023-03-08 NOTE — Telephone Encounter (Signed)
Caller name: Mcgarrett Sonner  On DPR?: Yes  Call back number: [phone] (mobile)  Provider they see: Alveria Apley, NP  Reason for call:  Pt was asking if he could get his labs done since it's been over 3 months. He is wondering if he still needs his cholesterol medicine. Pt was advised he could either come in for a lab visit or if he needs an office visit also. Please advise

## 2023-03-11 NOTE — Telephone Encounter (Signed)
Pot is going to check with insurance company.

## 2023-05-09 ENCOUNTER — Other Ambulatory Visit: Payer: Self-pay | Admitting: Family Medicine

## 2023-05-09 DIAGNOSIS — E785 Hyperlipidemia, unspecified: Secondary | ICD-10-CM

## 2023-05-13 ENCOUNTER — Other Ambulatory Visit: Payer: Medicare Other

## 2023-05-13 ENCOUNTER — Ambulatory Visit: Payer: Medicare Other | Admitting: Family Medicine

## 2023-05-20 NOTE — Progress Notes (Unsigned)
   Established Patient Office Visit   Subjective:  Patient ID: Jared Coffey, male    DOB: 1956/06/20  Age: 67 y.o. MRN: [id number]  No chief complaint on file.   HPI Hyperlipidemia: Chronic. Patient takes Pravastatin 40mg , 1 tablet daily. Increased from 20mg  at last lab draw, 6 months ago. Denies any abd pain, nausea, vomiting, or muscle cramps.  Lab Results  Component Value Date   CHOL 184 11/08/2022   HDL 61.70 11/08/2022   LDLCALC 103 (H) 11/08/2022   LDLDIRECT 103.0 09/04/2016   TRIG 100.0 11/08/2022   CHOLHDL 3 11/08/2022  The 10-year ASCVD risk score (Arnett DK, et al., 2019) is: 16.1%   GERD: Chronic. Patient takes Omeprazole about once a week for symptoms and it helps.  ROS See HPI above     Objective:     There were no vitals taken for this visit. {Vitals History (Optional):23777}  Physical Exam  No results found for any visits on 05/21/23.      Assessment & Plan:  There are no diagnoses linked to this encounter. 1.Review health maintenance:  -Covid booster: -Influenza vaccine:  -PNA vaccine:  -Follow up in 6 months for physical.  No follow-ups on file.   Zandra Abts, NP

## 2023-05-21 ENCOUNTER — Ambulatory Visit (INDEPENDENT_AMBULATORY_CARE_PROVIDER_SITE_OTHER): Payer: Medicare Other | Admitting: Family Medicine

## 2023-05-21 VITALS — BP 142/78 | HR 59 | Temp 98.1°F | Wt 177.8 lb

## 2023-05-21 DIAGNOSIS — E785 Hyperlipidemia, unspecified: Secondary | ICD-10-CM | POA: Diagnosis not present

## 2023-05-21 DIAGNOSIS — K219 Gastro-esophageal reflux disease without esophagitis: Secondary | ICD-10-CM | POA: Diagnosis not present

## 2023-05-21 DIAGNOSIS — L309 Dermatitis, unspecified: Secondary | ICD-10-CM

## 2023-05-21 MED ORDER — TRIAMCINOLONE ACETONIDE 0.1 % EX CREA
1.0000 | TOPICAL_CREAM | Freq: Two times a day (BID) | CUTANEOUS | 0 refills | Status: DC
Start: 2023-05-21 — End: 2023-06-04

## 2023-05-21 NOTE — Assessment & Plan Note (Signed)
Stable. Continue taking Pravastatin 40mg  daily. Ordered lipid panel and CMP.

## 2023-05-21 NOTE — Assessment & Plan Note (Signed)
Stable. Continue taking Omeprazole since it is effective.

## 2023-05-21 NOTE — Patient Instructions (Addendum)
-  It was a pleasure to see you today.  -Refilled Triamcinolone cream for itching skin. Recommend to mix 1 teaspoon of cream with 1 teaspoon of over the counter Eucerin cream.  -Ordered labs. Lab is closed at this location. You may make an appointment with Roy at [facility] location or [facility]. Office will call with lab results.  [facility] or xray: Walk in 8:30-4:30 during weekdays, no appointment needed [address]  [place], [place] [address]  -Continue all medications.  -Follow up in 6 months for physical.

## 2023-05-28 ENCOUNTER — Telehealth: Payer: Self-pay | Admitting: Family Medicine

## 2023-05-28 DIAGNOSIS — L309 Dermatitis, unspecified: Secondary | ICD-10-CM

## 2023-05-28 NOTE — Telephone Encounter (Signed)
Prescription Request  05/28/2023  LOV: 05/21/2023  What is the name of the medication or equipment?  triamcinolone cream (KENALOG) 0.1 %   Have you contacted your pharmacy to request a refill? No   Which pharmacy would you like this sent to?  [facility] [phone] - [place], [place] - [address] Phone: [phone]  Fax: [phone]     Patient notified that their request is being sent to the clinical staff for review and that they should receive a response within 2 business days.   Please advise at Mobile [phone] (mobile)

## 2023-06-04 ENCOUNTER — Other Ambulatory Visit (INDEPENDENT_AMBULATORY_CARE_PROVIDER_SITE_OTHER): Payer: Medicare Other

## 2023-06-04 DIAGNOSIS — E785 Hyperlipidemia, unspecified: Secondary | ICD-10-CM

## 2023-06-04 LAB — COMPREHENSIVE METABOLIC PANEL
ALT: 27 U/L (ref 0–53)
AST: 26 U/L (ref 0–37)
Albumin: 4.2 g/dL (ref 3.5–5.2)
Alkaline Phosphatase: 63 U/L (ref 39–117)
BUN: 15 mg/dL (ref 6–23)
CO2: 30 meq/L (ref 19–32)
Calcium: 9.5 mg/dL (ref 8.4–10.5)
Chloride: 104 meq/L (ref 96–112)
Creatinine, Ser: 1.02 mg/dL (ref 0.40–1.50)
GFR: 75.46 mL/min (ref >60.00)
Glucose, Bld: 99 mg/dL (ref 70–99)
Potassium: 4.2 meq/L (ref 3.5–5.1)
Sodium: 141 meq/L (ref 135–145)
Total Bilirubin: 0.5 mg/dL (ref 0.2–1.2)
Total Protein: 6.9 g/dL (ref 6.0–8.3)

## 2023-06-04 LAB — LIPID PANEL
Cholesterol: 167 mg/dL (ref 0–200)
HDL: 59.4 mg/dL (ref >39.00)
LDL Cholesterol: 92 mg/dL (ref 0–99)
NonHDL: 107.15
Total CHOL/HDL Ratio: 3
Triglycerides: 77 mg/dL (ref 0.0–149.0)
VLDL: 15.4 mg/dL (ref 0.0–40.0)

## 2023-06-04 MED ORDER — TRIAMCINOLONE ACETONIDE 0.1 % EX CREA
1.0000 | TOPICAL_CREAM | Freq: Two times a day (BID) | CUTANEOUS | 0 refills | Status: DC
Start: 2023-06-04 — End: 2023-12-18

## 2023-06-04 NOTE — Telephone Encounter (Signed)
Rx re-sent for patient to re-mix.

## 2023-06-04 NOTE — Telephone Encounter (Signed)
Pt came in person and asked for his prescription triamcinolone cream (Kenalog) to be refilled. He states that he was told to mix it 50/50 with an over the counter cream, however his wife misunderstood the instructions and mixed the entire tube with the over the counter cream. He would like a refill on his prescription as he states the mixture is not working as well.

## 2023-08-17 ENCOUNTER — Other Ambulatory Visit: Payer: Self-pay | Admitting: Family Medicine

## 2023-08-17 DIAGNOSIS — E785 Hyperlipidemia, unspecified: Secondary | ICD-10-CM

## 2023-09-30 ENCOUNTER — Ambulatory Visit (INDEPENDENT_AMBULATORY_CARE_PROVIDER_SITE_OTHER): Payer: Medicare (Managed Care) | Admitting: Family Medicine

## 2023-09-30 ENCOUNTER — Encounter: Payer: Self-pay | Admitting: Family Medicine

## 2023-09-30 VITALS — BP 138/62 | HR 80 | Temp 98.6°F | Resp 18 | Ht 67.0 in | Wt 186.0 lb

## 2023-09-30 DIAGNOSIS — J069 Acute upper respiratory infection, unspecified: Secondary | ICD-10-CM

## 2023-09-30 LAB — POCT INFLUENZA A/B
Influenza A, POC: NEGATIVE
Influenza B, POC: NEGATIVE

## 2023-09-30 LAB — POC COVID19 BINAXNOW: SARS Coronavirus 2 Ag: NEGATIVE

## 2023-09-30 MED ORDER — FLUTICASONE PROPIONATE 0.005 % EX OINT: 1.0000 | TOPICAL_OINTMENT | Freq: Two times a day (BID) | CUTANEOUS | 0 refills | Status: DC

## 2023-09-30 NOTE — Progress Notes (Signed)
 Assessment & Plan:  1. Viral URI (Primary) Education provided on viral URI.  Discussed typical duration and progression of viral illnesses.  Encouraged symptom management including hot tea/honey, cough syrup (Delsym), Tylenol/Ibuprofen, Tylenol Cold Day & Night OR Nyquil/Dayquil, Vicks, and a humidifier at night. Tylenol/Ibuprofen, Vicks, and a humidifier at night.  - POC COVID-19 BinaxNow - POCT Influenza A/B  Results for orders placed or performed in visit on 09/30/23  POC COVID-19 BinaxNow  Result Value Ref Range   SARS Coronavirus 2 Ag Negative Negative  POCT Influenza A/B  Result Value Ref Range   Influenza A, POC Negative Negative   Influenza B, POC Negative Negative    Follow up plan: Return if symptoms worsen or fail to improve.  Deliah Boston, MSN, APRN, FNP-C  Subjective:  HPI: Jared Coffey is a 68 y.o. male presenting on 09/30/2023 for Nasal Congestion (Head and chest congestion, slight cough, burning in chest at night, yellow drainage, some fever, no energy /Started: Fri and worse over weekend )  Patient complains of cough, head/chest congestion, headache, fever, postnasal drainage, fatigue, and a dry burning in his chest last night . He denies runny nose, sneezing, sore throat, nausea, vomiting, and diarrhea. Onset of symptoms was 3 days ago, gradually worsening since that time. He is drinking plenty of fluids. Evaluation to date: none. Treatment to date: none.  He does not smoke.    ROS: Negative unless specifically indicated above in HPI.   Relevant past medical history reviewed and updated as indicated.   Allergies and medications reviewed and updated.   Current Outpatient Medications:    mupirocin ointment (BACTROBAN) 2 %, Apply topically 2 (two) times daily., Disp: , Rfl:    Omeprazole Magnesium (PRILOSEC PO), Take by mouth once a week., Disp: , Rfl:    pravastatin (PRAVACHOL) 40 MG tablet, TAKE 1 TABLET(40 MG) BY MOUTH AT BEDTIME, Disp: 90 tablet, Rfl: 1    triamcinolone cream (KENALOG) 0.1 %, Apply 1 Application topically 2 (two) times daily., Disp: 30 g, Rfl: 0  Allergies  Allergen Reactions   Penicillins Rash    Objective:   BP 138/62   Pulse 80   Temp 98.6 F (37 C)   Resp 18   Ht 5\' 7"  (1.702 m)   Wt 186 lb (84.4 kg)   SpO2 98%   BMI 29.13 kg/m    Physical Exam Vitals reviewed.  Constitutional:      General: He is not in acute distress.    Appearance: Normal appearance. He is not ill-appearing, toxic-appearing or diaphoretic.  HENT:     Head: Normocephalic and atraumatic.     Right Ear: Tympanic membrane, ear canal and external ear normal. There is no impacted cerumen.     Left Ear: Tympanic membrane, ear canal and external ear normal. There is no impacted cerumen.     Nose: Nose normal.     Right Sinus: No maxillary sinus tenderness or frontal sinus tenderness.     Left Sinus: No maxillary sinus tenderness or frontal sinus tenderness.     Mouth/Throat:     Mouth: Mucous membranes are moist.     Pharynx: Oropharynx is clear. No oropharyngeal exudate or posterior oropharyngeal erythema.     Tonsils: No tonsillar exudate or tonsillar abscesses.  Eyes:     General: No scleral icterus.       Right eye: No discharge.        Left eye: No discharge.     Conjunctiva/sclera: Conjunctivae normal.  Cardiovascular:     Rate and Rhythm: Normal rate.  Pulmonary:     Effort: Pulmonary effort is normal. No respiratory distress.  Musculoskeletal:        General: Normal range of motion.     Cervical back: Normal range of motion.  Lymphadenopathy:     Cervical: No cervical adenopathy.  Skin:    General: Skin is warm and dry.  Neurological:     Mental Status: He is alert and oriented to person, place, and time. Mental status is at baseline.  Psychiatric:        Mood and Affect: Mood normal.        Behavior: Behavior normal.        Thought Content: Thought content normal.        Judgment: Judgment normal.

## 2023-09-30 NOTE — Patient Instructions (Addendum)
 Hot tea/honey, cough syrup (Delsym), Tylenol/Ibuprofen, Tylenol Cold Day & Night OR Nyquil/Dayquil, Vicks, and a humidifier at night.

## 2023-11-18 ENCOUNTER — Other Ambulatory Visit: Payer: Self-pay | Admitting: Family Medicine

## 2023-11-18 DIAGNOSIS — E785 Hyperlipidemia, unspecified: Secondary | ICD-10-CM

## 2023-11-18 MED ORDER — PRAVASTATIN SODIUM 40 MG PO TABS: 40.0000 mg | ORAL_TABLET | Freq: Every day | ORAL | 1 refills | Status: DC

## 2023-11-18 NOTE — Telephone Encounter (Signed)
 Copied from CRM [id number]. Topic: Clinical - Medication Refill >> Nov 18, 2023 10:18 AM Marlan Silva wrote: Most Recent Primary Care Visit:  Provider: Zorita Hiss  Department: [facility]  Visit Type: ACUTE  Date: 09/30/2023  Medication: pravastatin  (PRAVACHOL ) 40 MG tablet  Has the patient contacted their pharmacy? Yes (Agent: If no, request that the patient contact the pharmacy for the refill. If patient does not wish to contact the pharmacy document the reason why and proceed with request.) (Agent: If yes, when and what did the pharmacy advise?)  Is this the correct pharmacy for this prescription? Yes If no, delete pharmacy and type the correct one.  This is the patient's preferred pharmacy:  [id number] - [place], [place] - [address] [place] [address] Phone: [phone] Fax: [phone]   Has the prescription been filled recently? Yes  Is the patient out of the medication? Yes  Has the patient been seen for an appointment in the last year OR does the patient have an upcoming appointment? Yes  Can we respond through MyChart? Yes  Agent: Please be advised that Rx refills may take up to 3 business days. We ask that you follow-up with your pharmacy.

## 2023-12-03 ENCOUNTER — Telehealth: Payer: Self-pay | Admitting: Family Medicine

## 2023-12-03 NOTE — Telephone Encounter (Signed)
 Message was incorrectly sent to [facility] pool. Pt is a Jared Coffey pt currently.     Copied from CRM [id number]. Topic: Appointments - Scheduling Inquiry for Clinic >> Dec 02, 2023 12:48 PM Clyde Darling P wrote: Reason for CRM: Pt would like to know if he could combine the annual wellness visit and physical on June 4

## 2023-12-03 NOTE — Telephone Encounter (Signed)
 Called pt and left a vm with information to give us  a call back if he had any questions.

## 2023-12-18 ENCOUNTER — Encounter: Payer: Self-pay | Admitting: Family Medicine

## 2023-12-18 ENCOUNTER — Ambulatory Visit: Payer: Self-pay | Admitting: Family Medicine

## 2023-12-18 ENCOUNTER — Ambulatory Visit (INDEPENDENT_AMBULATORY_CARE_PROVIDER_SITE_OTHER): Payer: Medicare (Managed Care) | Admitting: Family Medicine

## 2023-12-18 VITALS — BP 122/78 | HR 59 | Temp 98.0°F | Ht 69.0 in | Wt 181.0 lb

## 2023-12-18 DIAGNOSIS — E663 Overweight: Secondary | ICD-10-CM | POA: Diagnosis not present

## 2023-12-18 DIAGNOSIS — K219 Gastro-esophageal reflux disease without esophagitis: Secondary | ICD-10-CM

## 2023-12-18 DIAGNOSIS — Z6827 Body mass index (BMI) 27.0-27.9, adult: Secondary | ICD-10-CM | POA: Diagnosis not present

## 2023-12-18 DIAGNOSIS — H6121 Impacted cerumen, right ear: Secondary | ICD-10-CM

## 2023-12-18 DIAGNOSIS — Z125 Encounter for screening for malignant neoplasm of prostate: Secondary | ICD-10-CM

## 2023-12-18 DIAGNOSIS — Z136 Encounter for screening for cardiovascular disorders: Secondary | ICD-10-CM

## 2023-12-18 DIAGNOSIS — E559 Vitamin D deficiency, unspecified: Secondary | ICD-10-CM

## 2023-12-18 DIAGNOSIS — E785 Hyperlipidemia, unspecified: Secondary | ICD-10-CM

## 2023-12-18 DIAGNOSIS — Z0001 Encounter for general adult medical examination with abnormal findings: Secondary | ICD-10-CM

## 2023-12-18 LAB — CBC WITH DIFFERENTIAL/PLATELET
Basophils Absolute: 0 K/uL (ref 0.0–0.1)
Basophils Relative: 0.5 % (ref 0.0–3.0)
Eosinophils Absolute: 0.3 K/uL (ref 0.0–0.7)
Eosinophils Relative: 5.2 % — ABNORMAL HIGH (ref 0.0–5.0)
HCT: 42.9 % (ref 39.0–52.0)
Hemoglobin: 14.7 g/dL (ref 13.0–17.0)
Lymphocytes Relative: 27.9 % (ref 12.0–46.0)
Lymphs Abs: 1.6 K/uL (ref 0.7–4.0)
MCHC: 34.3 g/dL (ref 30.0–36.0)
MCV: 87.7 fl (ref 78.0–100.0)
Monocytes Absolute: 0.5 K/uL (ref 0.1–1.0)
Monocytes Relative: 8.5 % (ref 3.0–12.0)
Neutro Abs: 3.2 K/uL (ref 1.4–7.7)
Neutrophils Relative %: 57.9 % (ref 43.0–77.0)
Platelets: 259 K/uL (ref 150.0–400.0)
RBC: 4.89 Mil/uL (ref 4.22–5.81)
RDW: 13.1 % (ref 11.5–15.5)
WBC: 5.6 K/uL (ref 4.0–10.5)

## 2023-12-18 LAB — COMPREHENSIVE METABOLIC PANEL WITH GFR
ALT: 24 U/L (ref 0–53)
AST: 25 U/L (ref 0–37)
Albumin: 4.4 g/dL (ref 3.5–5.2)
Alkaline Phosphatase: 63 U/L (ref 39–117)
BUN: 15 mg/dL (ref 6–23)
CO2: 29 meq/L (ref 19–32)
Calcium: 9.4 mg/dL (ref 8.4–10.5)
Chloride: 103 meq/L (ref 96–112)
Creatinine, Ser: 1.05 mg/dL (ref 0.40–1.50)
GFR: 72.6 mL/min
Glucose, Bld: 91 mg/dL (ref 70–99)
Potassium: 4.3 meq/L (ref 3.5–5.1)
Sodium: 139 meq/L (ref 135–145)
Total Bilirubin: 0.7 mg/dL (ref 0.2–1.2)
Total Protein: 6.6 g/dL (ref 6.0–8.3)

## 2023-12-18 LAB — LIPID PANEL
Cholesterol: 174 mg/dL (ref 0–200)
HDL: 54.5 mg/dL (ref >39.00)
LDL Cholesterol: 99 mg/dL (ref 0–99)
NonHDL: 119.25
Total CHOL/HDL Ratio: 3
Triglycerides: 100 mg/dL (ref 0.0–149.0)
VLDL: 20 mg/dL (ref 0.0–40.0)

## 2023-12-18 LAB — PSA: PSA: 0.6 ng/mL (ref 0.10–4.00)

## 2023-12-18 LAB — VITAMIN D 25 HYDROXY (VIT D DEFICIENCY, FRACTURES): VITD: 32.97 ng/mL (ref 30.00–100.00)

## 2023-12-18 LAB — TSH: TSH: 2.5 u[IU]/mL (ref 0.35–5.50)

## 2023-12-18 NOTE — Patient Instructions (Signed)
-  It was great to see you today. -Physical exam completed today.  -EKG completed today for cardiovascular screening and due to request. No concerns. Looks similar to your last EKG in 2017. -Right ear lavage due to impaction. -Ordered labs. Office will call with lab results and you will see them on MyChart. -Continue all prescribed medication. -Continue healthy diet and participating in regualar exercise. -Placed a referral to GI for heartburn and request for an endoscopy.  -Follow up in 6 months for chronic management.

## 2023-12-18 NOTE — Progress Notes (Signed)
 Complete physical exam  Patient: Jared Coffey   DOB: [birth year]   68 y.o. Male  MRN: [id number]  Subjective:     Chief Complaint  Patient presents with   Annual Exam    Jared Coffey is a 68 y.o. male who presents today for a complete physical exam. He reports consuming a general with trying to lower sodium and fat diet. Exercise: Cardio every other day; weight training every other day; He generally feels well. He reports sleeping well. He does not have additional problems to discuss today.    Most recent fall risk assessment:    12/18/2023    7:08 AM  Fall Risk   Falls in the past year? 0  Number falls in past yr: 0  Injury with Fall? 0  Risk for fall due to : No Fall Risks  Follow up Falls evaluation completed     Most recent depression screenings:    12/18/2023    7:08 AM 09/30/2023    2:01 PM  PHQ 2/9 Scores  PHQ - 2 Score 0 0  PHQ- 9 Score 0     Vision:Within last year and Dental: No current dental problems and Receives regular dental care  Past Medical History:  Diagnosis Date   GERD (gastroesophageal reflux disease)    H/O necrotizing fasciitis 2009   right upper thigh   Hyperlipidemia    lower with diet changes   Wears glasses    Wears partial dentures    Past Surgical History:  Procedure Laterality Date   COLONOSCOPY     declines, will refer for Cologuard 09/2015   LEG SURGERY  2009   right upper leg,necrotizing fascitis   NASAL SINUS SURGERY     age 75yo   Social History   Tobacco Use   Smoking status: Former    Current packs/day: 0.50    Average packs/day: 0.5 packs/day for 19.5 years (9.7 ttl pk-yrs)    Types: Cigarettes    Start date: 06/20/2004   Smokeless tobacco: Never  Vaping Use   Vaping status: Never Used  Substance Use Topics   Alcohol use: No    Alcohol/week: 0.0 standard drinks of alcohol   Drug use: No   Social History   Socioeconomic History   Marital status: Married    Spouse name: Not on file   Number of  children: 2   Years of education: Not on file   Highest education level: Not on file  Occupational History   Occupation: Tour [employer]  Tobacco Use   Smoking status: Former    Current packs/day: 0.50    Average packs/day: 0.5 packs/day for 19.5 years (9.7 ttl pk-yrs)    Types: Cigarettes    Start date: 06/20/2004   Smokeless tobacco: Never  Vaping Use   Vaping status: Never Used  Substance and Sexual Activity   Alcohol use: No    Alcohol/week: 0.0 standard drinks of alcohol   Drug use: No   Sexual activity: Not Currently  Other Topics Concern   Not on file  Social History Narrative      Social Drivers of Health   Financial Resource Strain: Low Risk  (11/07/2022)   Overall Financial Resource Strain (CARDIA)    Difficulty of Paying Living Expenses: Not hard at all  Food Insecurity: No Food Insecurity (11/07/2022)   Hunger Vital Sign    Worried About Running Out of Food in the Last Year: Never true    Ran Out of Food in the Last Year:  Never true  Transportation Needs: No Transportation Needs (11/07/2022)   PRAPARE - Administrator, Civil Service (Medical): No    Lack of Transportation (Non-Medical): No  Physical Activity: Sufficiently Active (11/07/2022)   Exercise Vital Sign    Days of Exercise per Week: 3 days    Minutes of Exercise per Session: 90 min  Stress: No Stress Concern Present (11/07/2022)   [employer] of Occupational Health - Occupational Stress Questionnaire    Feeling of Stress : Only a little  Social Connections: Moderately Isolated (11/07/2022)   Social Connection and Isolation Panel [NHANES]    Frequency of Communication with Friends and Family: Three times a week    Frequency of Social Gatherings with Friends and Family: Twice a week    Attends Religious Services: Never    Database administrator or Organizations: No    Attends Banker Meetings: Never    Marital Status: Married  Catering manager Violence: Not At Risk (11/07/2022)    Humiliation, Afraid, Rape, and Kick questionnaire    Fear of Current or Ex-Partner: No    Emotionally Abused: No    Physically Abused: No    Sexually Abused: No   Family Status  Relation Name Status   Mother  Deceased   Father  Deceased   Sister  Alive   Sister 1/2 Alive   MGM  Deceased   MGF  Deceased   PGM  Deceased   PGF  Deceased   Daughter  Alive   Daughter  Alive   Neg Hx  (Not Specified)  No partnership data on file   Family History  Problem Relation Age of Onset   Stroke Mother    Dementia Mother        vascular   Cancer Father 3       died of bladder cancer   Osteoporosis Sister    Stevens-Johnson syndrome Sister    Diabetes Neg Hx    Heart disease Neg Hx    Hypertension Neg Hx    Colon cancer Neg Hx    Colon polyps Neg Hx    Rectal cancer Neg Hx    Stomach cancer Neg Hx    Allergies  Allergen Reactions   Penicillins Rash    Patient Care Team: Francenia Ingle, NP as PCP - General (Family Medicine) Katherin Pam, MD as Referring Physician (Specialist)   Outpatient Medications Prior to Visit  Medication Sig   augmented betamethasone  dipropionate (DIPROLENE -AF) 0.05 % cream Apply 1 Application topically 2 (two) times daily.   fluticasone  (CUTIVATE ) 0.005 % ointment Apply 1 Application topically 2 (two) times daily.   Omeprazole  Magnesium (PRILOSEC PO) Take by mouth once a week.   pravastatin  (PRAVACHOL ) 40 MG tablet Take 1 tablet (40 mg total) by mouth daily.   [DISCONTINUED] mupirocin  ointment (BACTROBAN ) 2 % Apply topically 2 (two) times daily.   [DISCONTINUED] triamcinolone  cream (KENALOG ) 0.1 % Apply 1 Application topically 2 (two) times daily.   No facility-administered medications prior to visit.    Review of Systems  Constitutional:  Positive for malaise/fatigue (Intermittent).  HENT: Negative.    Eyes: Negative.   Respiratory: Negative.    Cardiovascular: Negative.   Gastrointestinal:  Positive for heartburn.  Genitourinary:  Negative.   Musculoskeletal: Negative.   Skin:  Positive for itching.  Neurological: Negative.   Endo/Heme/Allergies: Negative.   Psychiatric/Behavioral: Negative.    See HPI above   Objective:   BP 122/78   Pulse Jared Aas)  59   Temp 98 F (36.7 C) (Oral)   Ht 5\' 9"  (1.753 m)   Wt 181 lb (82.1 kg)   SpO2 96%   BMI 26.73 kg/m    Physical Exam Vitals reviewed.  Constitutional:      General: He is not in acute distress.    Appearance: Normal appearance. He is overweight. He is not ill-appearing or toxic-appearing.  HENT:     Head: Normocephalic and atraumatic.     Right Ear: There is impacted cerumen.     Left Ear: Tympanic membrane, ear canal and external ear normal. There is no impacted cerumen.     Nose:     Right Sinus: No maxillary sinus tenderness or frontal sinus tenderness.     Left Sinus: No maxillary sinus tenderness or frontal sinus tenderness.     Mouth/Throat:     Mouth: Mucous membranes are moist.     Pharynx: Oropharynx is clear. Uvula midline. No pharyngeal swelling, oropharyngeal exudate, posterior oropharyngeal erythema, uvula swelling or postnasal drip.  Eyes:     General:        Right eye: No discharge.        Left eye: No discharge.     Conjunctiva/sclera: Conjunctivae normal.     Pupils: Pupils are equal, round, and reactive to light.  Neck:     Thyroid : No thyromegaly.  Cardiovascular:     Rate and Rhythm: Normal rate and regular rhythm.     Pulses:          Posterior tibial pulses are 2+ on the right side and 2+ on the left side.     Heart sounds: Normal heart sounds. No murmur heard.    No friction rub. No gallop.  Pulmonary:     Effort: Pulmonary effort is normal. No respiratory distress.     Breath sounds: Normal breath sounds.  Abdominal:     General: Abdomen is flat. Bowel sounds are normal.     Palpations: Abdomen is soft. There is no mass.     Tenderness: There is no abdominal tenderness. There is no right CVA tenderness or left CVA  tenderness.  Musculoskeletal:        General: Normal range of motion.     Cervical back: Normal range of motion.     Right lower leg: No edema.     Left lower leg: No edema.  Lymphadenopathy:     Head:     Right side of head: No submental or submandibular adenopathy.     Left side of head: No submental or submandibular adenopathy.  Skin:    General: Skin is warm and dry.  Neurological:     General: No focal deficit present.     Mental Status: He is alert and oriented to person, place, and time. Mental status is at baseline.     Motor: No weakness.     Gait: Gait normal.  Psychiatric:        Mood and Affect: Mood normal.        Behavior: Behavior normal.        Thought Content: Thought content normal.        Judgment: Judgment normal.    EKG performed for cardiovascular screening. Patient requested. Sinus brady. Same rhythm with no changes from prior EKG on 11/15/2015.  PRE-PROCEDURE EXAM: Right TM cannot be visualized due to total occlusion/impaction of the ear canal. PROCEDURE INDICATION: Remove wax to visualize ear drum & relieve discomfort CONSENT:  Verbal PROCEDURE NOTE:  Left and Right ear: The CMA, Kyleigh Sigmon, irrigated right ear with warm water and ear drops to remove the wax. 100% of the wax was removed.  POST- PROCEDURE EXAM: TMs successfully visualized. TM with no erythema. The patient tolerated the procedure well.      Assessment & Plan:    Routine Health Maintenance and Physical Exam  Immunization History  Administered Date(s) Administered   Hepatitis B 10/28/2012, 11/27/2012   Hepatitis B, ADULT 04/30/2013   Influenza, High Dose Seasonal PF 10/05/2020   Influenza,inj,Quad PF,6+ Mos 05/25/2014, 08/30/2016   Influenza-Unspecified 04/16/2022, 05/14/2023   PFIZER Comirnaty(Gray Top)Covid-19 Tri-Sucrose Vaccine 10/23/2019, 11/16/2019, 05/26/2020   PFIZER(Purple Top)SARS-COV-2 Vaccination 10/23/2019, 11/16/2019, 05/26/2020   PNEUMOCOCCAL CONJUGATE-20 11/22/2022    PPD Test 10/13/2012, 10/28/2012, 06/28/2014   Pfizer Covid-19 Vaccine Bivalent Booster 71yrs & up 10/05/2021, 01/15/2022   Pneumococcal Polysaccharide-23 10/05/2020   RSV,unspecified 07/31/2022   Respiratory Syncytial Virus Vaccine,Recomb Aduvanted(Arexvy) 07/31/2022   Tdap 10/13/2012, 10/05/2021   Unspecified SARS-COV-2 Vaccination 04/16/2022   Zoster Recombinant(Shingrix ) 11/16/2016, 03/06/2017    Health Maintenance  Topic Date Due   COVID-19 Vaccine (10 - 2024-25 season) 03/31/2023   Medicare Annual Wellness (AWV)  11/08/2023   INFLUENZA VACCINE  02/28/2024   Colonoscopy  02/18/2026   DTaP/Tdap/Td (3 - Td or Tdap) 10/06/2031   Pneumonia Vaccine 52+ Years old  Completed   Hepatitis C Screening  Completed   Zoster Vaccines- Shingrix   Completed   HPV VACCINES  Aged Out   Meningococcal B Vaccine  Aged Out   Fecal DNA (Cologuard)  Discontinued    Discussed health benefits of physical activity, and encouraged him to engage in regular exercise appropriate for his age and condition.  Abnormal wellness exam  Gastroesophageal reflux disease without esophagitis -     Ambulatory referral to Gastroenterology  Hyperlipidemia, unspecified hyperlipidemia type -     Lipid panel  Vitamin D  deficiency -     VITAMIN D  25 Hydroxy (Vit-D Deficiency, Fractures)  Prostate cancer screening -     PSA  Overweight -     CBC with Differential/Platelet -     Comprehensive metabolic panel with GFR -     TSH  Right ear impacted cerumen  Screening for cardiovascular condition -     EKG 12-Lead  1.Review health maintenance: -Covid booster: Fall in [place] at [place]  -AWV: scheduled 06/04  2.Physical exam completed today.  3.EKG completed today for cardiovascular screening and due to patient request. Sinus brady No concerns. Looks similar to last EKG in 2017. 4. Right ear lavage due to impaction. Successful.  5.Ordered labs (CBC, CMP, TSH, Vitamin D , Lipid Panel) based on  BMI-overweight and hyperlipidemia. Fasting. 6.Continue all prescribed medication. 7.Continue healthy diet and participating in regualar exercise. 8. Placed a referral to GI for heartburn and request for an endoscopy.   Return in about 6 months (around 06/19/2024) for chronic management.   Nelta Caudill, NP

## 2023-12-30 ENCOUNTER — Ambulatory Visit (INDEPENDENT_AMBULATORY_CARE_PROVIDER_SITE_OTHER): Payer: Medicare (Managed Care) | Admitting: Family Medicine

## 2023-12-30 ENCOUNTER — Ambulatory Visit: Payer: Medicare (Managed Care) | Admitting: Family Medicine

## 2023-12-30 ENCOUNTER — Encounter: Payer: Self-pay | Admitting: Family Medicine

## 2023-12-30 VITALS — BP 160/72 | HR 56 | Temp 98.0°F | Ht 69.0 in | Wt 181.0 lb

## 2023-12-30 DIAGNOSIS — L290 Pruritus ani: Secondary | ICD-10-CM

## 2023-12-30 DIAGNOSIS — L309 Dermatitis, unspecified: Secondary | ICD-10-CM | POA: Diagnosis not present

## 2023-12-30 DIAGNOSIS — L03115 Cellulitis of right lower limb: Secondary | ICD-10-CM

## 2023-12-30 MED ORDER — DOXYCYCLINE HYCLATE 100 MG PO TABS: 100.0000 mg | ORAL_TABLET | Freq: Two times a day (BID) | ORAL | 0 refills | Status: AC

## 2023-12-30 MED ORDER — BETAMETHASONE DIPROPIONATE AUG 0.05 % EX OINT: TOPICAL_OINTMENT | Freq: Two times a day (BID) | CUTANEOUS | 0 refills | Status: AC

## 2023-12-30 MED ORDER — FLUTICASONE PROPIONATE 0.005 % EX OINT
1.0000 | TOPICAL_OINTMENT | Freq: Two times a day (BID) | CUTANEOUS | 0 refills | Status: AC
Start: 2023-12-30 — End: ?

## 2023-12-30 NOTE — Progress Notes (Signed)
   Acute Office Visit   Subjective:  Patient ID: Jared Coffey, male    DOB: [birth year], 68 y.o.   MRN: [id number]  Chief Complaint  Patient presents with   Leg Pain    Swollen and painful site on leg.     Leg Pain    Patient is here for an acute visit. His right lower lateral leg is red, inflamed, and painful. He works out in [employer] a lot. He thinks he may have got bit by an insect or tick. He denies pulling a tick off. In the center, there appears to be a puncture wound. No drainage.   Also, request some of his creams refilled.  ROS See HPI above      Objective:   BP (!) 160/72   Pulse (!) 56   Temp 98 F (36.7 C) (Oral)   Ht 5\' 9"  (1.753 m)   Wt 181 lb (82.1 kg)   SpO2 98%   BMI 26.73 kg/m    Physical Exam Vitals reviewed.  Constitutional:      General: He is not in acute distress.    Appearance: Normal appearance. He is not ill-appearing, toxic-appearing or diaphoretic.  Eyes:     General:        Right eye: No discharge.        Left eye: No discharge.     Conjunctiva/sclera: Conjunctivae normal.  Cardiovascular:     Rate and Rhythm: Normal rate.  Pulmonary:     Effort: Pulmonary effort is normal. No respiratory distress.  Musculoskeletal:        General: Normal range of motion.  Skin:    General: Skin is warm and dry.     Comments: See picture included of right lateral, lower leg.   Neurological:     General: No focal deficit present.     Mental Status: He is alert and oriented to person, place, and time. Mental status is at baseline.  Psychiatric:        Mood and Affect: Mood normal.        Behavior: Behavior normal.        Thought Content: Thought content normal.        Judgment: Judgment normal.         Assessment & Plan:  Cellulitis of right lower extremity -     Doxycycline Hyclate; Take 1 tablet (100 mg total) by mouth 2 (two) times daily for 14 days.  Dispense: 28 tablet; Refill: 0  Anal pruritus -     Fluticasone  Propionate; Apply 1  Application topically 2 (two) times daily.  Dispense: 30 g; Refill: 0  Dermatitis -     Betamethasone  Dipropionate Aug; Apply topically 2 (two) times daily.  Dispense: 30 g; Refill: 0  -Prescribed Doxycycline 100mg  tablet, 1 tablet every 12 hours (twice a day) for 14 days. If not improved or becomes worse, follow up.  -Keep area dry, cleanse area with a mild soap and pat dry.  -Recommend to apply cool compresses to the area, 4-6 times a day, up to 20 minutes at a time. Avoid heat to the area. -Refilled Fluticasone  ointment  and Betamethasone  ointment.   Sarabi Sockwell, NP

## 2023-12-30 NOTE — Patient Instructions (Addendum)
-  Prescribed Doxycycline 100mg  tablet, 1 tablet every 12 hours (twice a day) for 14 days. If not improved or becomes worse, follow up.  -Keep area dry, cleanse area with a mild soap and pat dry.  -Recommend to apply cool compresses to the area, 4-6 times a day, up to 20 minutes at a time. Avoid heat to the area. -Refilled Fluticasone  ointment  and Betamethasone  ointment.

## 2023-12-31 ENCOUNTER — Ambulatory Visit (INDEPENDENT_AMBULATORY_CARE_PROVIDER_SITE_OTHER): Payer: Medicare (Managed Care) | Admitting: Family Medicine

## 2023-12-31 ENCOUNTER — Encounter: Payer: Self-pay | Admitting: Family Medicine

## 2023-12-31 ENCOUNTER — Ambulatory Visit: Payer: Self-pay

## 2023-12-31 VITALS — BP 130/58 | HR 60 | Temp 97.8°F | Ht 69.0 in | Wt 182.1 lb

## 2023-12-31 DIAGNOSIS — B353 Tinea pedis: Secondary | ICD-10-CM | POA: Diagnosis not present

## 2023-12-31 DIAGNOSIS — L03115 Cellulitis of right lower limb: Secondary | ICD-10-CM | POA: Diagnosis not present

## 2023-12-31 MED ORDER — KETOCONAZOLE 2 % EX CREA: 1.0000 | TOPICAL_CREAM | Freq: Two times a day (BID) | CUTANEOUS | 1 refills | Status: AC

## 2023-12-31 NOTE — Telephone Encounter (Signed)
 Copied from CRM [id number]. Topic: Clinical - Red Word Triage >> Dec 31, 2023 12:47 PM Jared Coffey wrote: Red Word that prompted transfer to Nurse Triage:swollen leg, received antibiotics, sxs not improved, concerned if it may be flesh eating bacteria.   Chief Complaint: Cellulitis of leg  Disposition: [] ED /[] Urgent Care (no appt availability in office) / [x] Appointment(In office/virtual)/ []  [facility]/ [] Home Care/ [] Refused Recommended Disposition /[] [facility]/ []  Follow-up with PCP Additional Notes: Patient seen and treated yesterday for cellulitis of the left lower leg. Patient states that he was told to make an appointment today if his symptoms had not improved after 24 hours. Appointment made for the patient today.     Reason for Disposition  [1] Taking antibiotics > 24 hours AND [2] symptoms WORSE    Not improving, was instructed to come back today  Answer Assessment - Initial Assessment Questions 1. INFECTION: "What infection is the antibiotic being given for?"     Cellulitis of right lower leg  2. ANTIBIOTIC: "What antibiotic are you taking" "How many times per day?"     Doxycycline  3. DURATION: "When was the antibiotic started?"     Yesterday  4. MAIN CONCERN OR SYMPTOM:  "What is your main concern right now?"     Not improving  5. BETTER-SAME-WORSE: "Are you getting better, staying the same, or getting worse compared to when you first started the antibiotics?" If getting worse, ask: "In what way?"      Same 6. FEVER: "Do you have a fever?" If Yes, ask: "What is your temperature, how was it measured, and when did it start?"     No 7. SYMPTOMS: "Are there any other symptoms you're concerned about?" If Yes, ask: "When did it start?"     No 8. FOLLOW-UP APPOINTMENT: "Do you have a follow-up appointment with your doctor?"     No  Protocols used: Infection on Antibiotic Follow-up Call-[facility]

## 2023-12-31 NOTE — Progress Notes (Signed)
   Acute Office Visit  Subjective:     Patient ID: Jared Coffey, male    DOB: February 28, 1956, 68 y.o.   MRN: [id number]  Chief Complaint  Patient presents with   Cellulitis    Patient presents for follow up cellulitis of RLE, complains of recurrent redness and swelling    HPI Patient is in today for repeat check on the cellulitis of the right ankle. He reports he has taken three doses of the doxycycline and he is worried that it isn't getting better. States that there is more swelling today and the redness is more confluent/ darker in color. Denies any fever or chills, no malaise  Pt is also reporting left foot itching and toe redness between his first and second toes.   Review of Systems  All other systems reviewed and are negative.       Objective:    BP (!) 130/58   Pulse 60   Temp 97.8 F (36.6 C) (Oral)   Ht 5\' 9"  (1.753 m)   Wt 182 lb 1.6 oz (82.6 kg)   SpO2 97%   BMI 26.89 kg/m    Physical Exam Constitutional:      Appearance: Normal appearance. He is normal weight.  Pulmonary:     Effort: Pulmonary effort is normal.  Musculoskeletal:     Right ankle: Swelling present.     Comments: There is a confluent darker red area on the skin with mild swelling on the lateral portion. This was compared to yesterday's photos and the size is about the same, the redness is darker -- it Is no longer bright red but a darker red  Neurological:     Mental Status: He is alert.     No results found for any visits on 12/31/23.      Assessment & Plan:   Problem List Items Addressed This Visit   None Visit Diagnoses       Tinea pedis of left foot    -  Primary   Relevant Medications   ketoconazole (NIZORAL) 2 % cream     Cellulitis of right lower extremity         I reassured the patient that the cellulitis is healing as expected. The redness of the toes on the left foot is most likely a fungal infection and I recommended using ketoconazole cream. RTC PRN  Meds  ordered this encounter  Medications   ketoconazole (NIZORAL) 2 % cream    Sig: Apply 1 Application topically 2 (two) times daily.    Dispense:  60 g    Refill:  1    No follow-ups on file.  Aida House, MD

## 2024-01-01 ENCOUNTER — Ambulatory Visit: Payer: Medicare (Managed Care)

## 2024-01-18 ENCOUNTER — Emergency Department (HOSPITAL_COMMUNITY)
Admission: EM | Admit: 2024-01-18 | Discharge: 2024-01-19 | Discharge status: Against Medical Advice | Payer: Medicare (Managed Care) | Attending: Emergency Medicine | Admitting: Emergency Medicine

## 2024-01-18 ENCOUNTER — Other Ambulatory Visit: Payer: Self-pay

## 2024-01-18 DIAGNOSIS — J029 Acute pharyngitis, unspecified: Secondary | ICD-10-CM | POA: Insufficient documentation

## 2024-01-18 DIAGNOSIS — Z5321 Procedure and treatment not carried out due to patient leaving prior to being seen by health care provider: Secondary | ICD-10-CM | POA: Insufficient documentation

## 2024-01-18 DIAGNOSIS — L299 Pruritus, unspecified: Secondary | ICD-10-CM | POA: Diagnosis not present

## 2024-01-18 NOTE — ED Triage Notes (Signed)
 Patient presents with multiple complaints: Sore Throat last night , toes redness and itchy /swollen penis onset last night , denies injury, no fever or chills. Respirations unlabored. Ambulatory.

## 2024-01-19 DIAGNOSIS — R234 Changes in skin texture: Secondary | ICD-10-CM | POA: Diagnosis not present

## 2024-01-19 NOTE — ED Notes (Signed)
 Pt called for a room with no answer. Pt not seen in the ED.

## 2024-01-19 NOTE — Progress Notes (Deleted)
 Ellouise Console, PA-C [address] [place], [place]  [address] Phone: [phone]   Primary Care Physician: Billy Philippe SAUNDERS, NP  Primary Gastroenterologist:  Ellouise Console, PA-C / Dr. Gordy Starch   Chief Complaint:  GERD       HPI:   Jared Coffey is a 69 y.o. male is here to evaluate acid reflux.  He is currently taking OTC omeprazole  20 Mg once per week.  Current symptoms.  01/2023 screening colonoscopy by Dr. Starch:  20 mm sessile serrated polyp removed from proximal ascending colon.  11 other smaller tubular adenoma polyps removed ranging in size from 3 mm to 6 mm.  There was no dysplasia or carcinoma.  Small internal hemorrhoids.  Excellent prep.  3-year repeat (due 01/2026).  No previous EGD.  12/18/2023 labs normal CBC (Hgb 14.7).  Normal CMP.  Current Outpatient Medications  Medication Sig Dispense Refill   augmented betamethasone  dipropionate (DIPROLENE -AF) 0.05 % ointment Apply topically 2 (two) times daily. 30 g 0   fluticasone  (CUTIVATE ) 0.005 % ointment Apply 1 Application topically 2 (two) times daily. 30 g 0   ketoconazole  (NIZORAL ) 2 % cream Apply 1 Application topically 2 (two) times daily. 60 g 1   Omeprazole  Magnesium (PRILOSEC PO) Take by mouth once a week.     pravastatin  (PRAVACHOL ) 40 MG tablet Take 1 tablet (40 mg total) by mouth daily. 90 tablet 1   No current facility-administered medications for this visit.    Allergies as of 01/20/2024 - Review Complete 12/31/2023  Allergen Reaction Noted   Benzonatate   05/21/2021   Penicillins Rash 06/20/2012    Past Medical History:  Diagnosis Date   GERD (gastroesophageal reflux disease)    H/O necrotizing fasciitis 2009   right upper thigh   Hyperlipidemia    lower with diet changes   Wears glasses    Wears partial dentures     Past Surgical History:  Procedure Laterality Date   COLONOSCOPY     declines, will refer for Cologuard 09/2015   LEG SURGERY  2009   right upper leg,necrotizing  fascitis   NASAL SINUS SURGERY     age 9yo    Review of Systems:    All systems reviewed and negative except where noted in HPI.    Physical Exam:  There were no vitals taken for this visit. No LMP for male patient.  General: Well-nourished, well-developed in no acute distress.  Lungs: Clear to auscultation bilaterally. Non-labored. Heart: Regular rate and rhythm, no murmurs rubs or gallops.  Abdomen: Bowel sounds are normal; Abdomen is Soft; No hepatosplenomegaly, masses or hernias;  No Abdominal Tenderness; No guarding or rebound tenderness. Neuro: Alert and oriented x 3.  Grossly intact.  Psych: Alert and cooperative, normal mood and affect.   Imaging Studies: No results found.  Labs: CBC    Component Value Date/Time   WBC 5.6 12/18/2023 0818   RBC 4.89 12/18/2023 0818   HGB 14.7 12/18/2023 0818   HCT 42.9 12/18/2023 0818   PLT 259.0 12/18/2023 0818   MCV 87.7 12/18/2023 0818   MCH 30.1 05/29/2018 1532   MCHC 34.3 12/18/2023 0818   RDW 13.1 12/18/2023 0818   LYMPHSABS 1.6 12/18/2023 0818   MONOABS 0.5 12/18/2023 0818   EOSABS 0.3 12/18/2023 0818   BASOSABS 0.0 12/18/2023 0818    CMP     Component Value Date/Time   NA 139 12/18/2023 0818   K 4.3 12/18/2023 0818   CL 103 12/18/2023  0818   CO2 29 12/18/2023 0818   GLUCOSE 91 12/18/2023 0818   BUN 15 12/18/2023 0818   CREATININE 1.05 12/18/2023 0818   CREATININE 1.01 05/29/2018 1532   CALCIUM  9.4 12/18/2023 0818   PROT 6.6 12/18/2023 0818   ALBUMIN 4.4 12/18/2023 0818   AST 25 12/18/2023 0818   ALT 24 12/18/2023 0818   ALKPHOS 63 12/18/2023 0818   BILITOT 0.7 12/18/2023 0818       Assessment and Plan:   Jared Coffey is a 68 y.o. y/o male presents for:  1.  GERD - Rx PPI, H2 RB - GERD diet - EGD to screen for Barrett's  2.  History of multiple adenomatous polyps - 3-year repeat colonoscopy will be due 01/2026.    Ellouise Console, PA-C  Follow up ***

## 2024-01-20 ENCOUNTER — Ambulatory Visit: Payer: Medicare (Managed Care) | Admitting: Physician Assistant

## 2024-01-21 DIAGNOSIS — L309 Dermatitis, unspecified: Secondary | ICD-10-CM | POA: Diagnosis not present

## 2024-01-21 DIAGNOSIS — L308 Other specified dermatitis: Secondary | ICD-10-CM | POA: Diagnosis not present

## 2024-01-21 DIAGNOSIS — N481 Balanitis: Secondary | ICD-10-CM | POA: Diagnosis not present

## 2024-01-22 ENCOUNTER — Telehealth: Payer: Self-pay

## 2024-01-22 NOTE — Transitions of Care (Post Inpatient/ED Visit) (Signed)
   01/22/2024  Name: Jared Coffey MRN: [id number] DOB: 02-Apr-1956  Today's TOC FU Call Status: Today's TOC FU Call Status:: Successful TOC FU Call Completed TOC FU Call Complete Date: 01/22/24 Patient's Name and Date of Birth confirmed.  Transition Care Management Follow-up Telephone Call Date of Discharge: 01/19/24 Discharge Facility: [facility]) Type of Discharge: Emergency Department Reason for ED Visit: Other: How have you been since you were released from the hospital?: Same Any questions or concerns?: No  Items Reviewed: Did you receive and understand the discharge instructions provided?: Yes Medications obtained,verified, and reconciled?: Yes (Medications Reviewed) Any new allergies since your discharge?: No Dietary orders reviewed?: No Do you have support at home?: No  Medications Reviewed Today: Medications Reviewed Today     Reviewed by Elner Shan NOVAK, CMA (Certified Medical Assistant) on 01/22/24 at 1417  Med List Status: <None>   Medication Order Taking? Sig Documenting Provider Last Dose Status Informant  augmented betamethasone  dipropionate (DIPROLENE -AF) 0.05 % ointment [id number] Yes Apply topically 2 (two) times daily. Billy Philippe SAUNDERS, NP  Active   fluticasone  (CUTIVATE ) 0.005 % ointment [id number] Yes Apply 1 Application topically 2 (two) times daily. Billy Philippe SAUNDERS, NP  Active   ketoconazole  (NIZORAL ) 2 % cream [id number] Yes Apply 1 Application topically 2 (two) times daily. Ozell Heron HERO, MD  Active   Omeprazole  Magnesium (PRILOSEC PO) [id number] Yes Take by mouth once a week. [provider]  Active   pravastatin  (PRAVACHOL ) 40 MG tablet [id number] Yes Take 1 tablet (40 mg total) by mouth daily. Billy Philippe SAUNDERS, NP  Active             Home Care and Equipment/Supplies: Were Home Health Services Ordered?: No Any new equipment or medical supplies ordered?: No  Functional Questionnaire: Do you need assistance with  bathing/showering or dressing?: No Do you need assistance with meal preparation?: No Do you need assistance with eating?: No Do you have difficulty maintaining continence: No Do you need assistance with getting out of bed/getting out of a chair/moving?: No Do you have difficulty managing or taking your medications?: No  Follow up appointments reviewed: PCP Follow-up appointment confirmed?: NA Specialist Hospital Follow-up appointment confirmed?: Yes Follow-Up Specialty Provider:: dermatology Do you need transportation to your follow-up appointment?: No Do you understand care options if your condition(s) worsen?: Yes-patient verbalized understanding    SIGNATURE Tuana Hoheisel, cma

## 2024-02-19 ENCOUNTER — Encounter: Payer: Self-pay | Admitting: Physician Assistant

## 2024-02-19 ENCOUNTER — Ambulatory Visit: Payer: Medicare (Managed Care) | Admitting: Physician Assistant

## 2024-02-19 VITALS — BP 136/60 | HR 61 | Ht 67.0 in | Wt 181.5 lb

## 2024-02-19 DIAGNOSIS — Z860101 Personal history of adenomatous and serrated colon polyps: Secondary | ICD-10-CM | POA: Diagnosis not present

## 2024-02-19 DIAGNOSIS — K219 Gastro-esophageal reflux disease without esophagitis: Secondary | ICD-10-CM | POA: Diagnosis not present

## 2024-02-19 NOTE — Patient Instructions (Addendum)
 Continue taking Omeprazole  over the counter 20 mg once daily  You have been scheduled for an Endoscopy. Please follow written instructions given to you at your visit today.  If you use inhalers (even only as needed), please bring them with you on the day of your procedure.  If you take any of the following medications, they will need to be adjusted prior to your procedure:   DO NOT TAKE 7 DAYS PRIOR TO TEST- Trulicity (dulaglutide) Ozempic, Wegovy (semaglutide) Mounjaro (tirzepatide) Bydureon Bcise (exanatide extended release)  DO NOT TAKE 1 DAY PRIOR TO YOUR TEST Rybelsus (semaglutide) Adlyxin (lixisenatide) Victoza (liraglutide) Byetta (exanatide) ___________________________________________________________________________  Please follow up sooner if symptoms increase or worsen  Due to recent changes in healthcare laws, you may see the results of your imaging and laboratory studies on MyChart before your provider has had a chance to review them.  We understand that in some cases there may be results that are confusing or concerning to you. Not all laboratory results come back in the same time frame and the provider may be waiting for multiple results in order to interpret others.  Please give us  48 hours in order for your provider to thoroughly review all the results before contacting the office for clarification of your results.   Thank you for trusting me with your gastrointestinal care!   Ellouise Console, PA-C _______________________________________________________  If your blood pressure at your visit was 140/90 or greater, please contact your primary care physician to follow up on this.  _______________________________________________________  If you are age 55 or older, your body mass index should be between 23-30. Your Body mass index is 28.43 kg/m. If this is out of the aforementioned range listed, please consider follow up with your Primary Care Provider.  If you are age  80 or younger, your body mass index should be between 19-25. Your Body mass index is 28.43 kg/m. If this is out of the aformentioned range listed, please consider follow up with your Primary Care Provider.   ________________________________________________________  The [facility] providers would like to encourage you to use MYCHART to communicate with providers for non-urgent requests or questions.  Due to long hold times on the telephone, sending your provider a message by [facility] may be a faster and more efficient way to get a response.  Please allow 48 business hours for a response.  Please remember that this is for non-urgent requests.  _______________________________________________________

## 2024-02-19 NOTE — Progress Notes (Signed)
 Ellouise Console, PA-C [address] [place], [place]  [address] Phone: [phone]   Gastroenterology Consultation  Referring Provider:     Billy Philippe SAUNDERS, NP Primary Care Physician:  Billy Philippe SAUNDERS, NP Primary Gastroenterologist:  Ellouise Console, PA-C / Dr. Gordy Starch  Reason for Consultation:     GERD        HPI:   Jared Coffey is a 68 y.o. y/o male referred for consultation & management  by Billy Philippe SAUNDERS, NP.    Current symptoms: Patient states he has had acid reflux intermittently for 30 years.  He has been on OTC Prilosec intermittently for 10 years.  He takes OTC Tums as needed.  Prilosec 20 Mg once daily.  Has occasional episode of breakthrough heartburn if he misses medication for several days.  He denies dysphagia, abdominal pain, or unintentional weight loss.  No family history of esophageal, stomach, or GI cancers.  He is requesting to schedule EGD to check his esophagus.  He is worried acid reflux has damaged his esophagus over time.  No previous EGD.  Former tobacco and alcohol use years ago.  No tobacco or alcohol in many years.  01/2023 First Screening Colonoscopy by Dr. Starch: 20 mm polyp removed from proximal ascending colon.  4 mm polyp removed from mid ascending colon.  5 sessile polyps removed from transverse colon (3 mm to 6 mm).  2 (3 mm to 5 mm) polyps removed from sigmoid colon.  Total of 9 polyps.  Pathology showed mixture of tubular adenomas and sessile serrated polyps.  No dysplasia.  Small internal hemorrhoids.  Excellent prep.  3-year repeat colonoscopy was recommended (due 01/2026).   Past Medical History:  Diagnosis Date   GERD (gastroesophageal reflux disease)    H/O necrotizing fasciitis 2009   right upper thigh   Hyperlipidemia    lower with diet changes   Wears glasses    Wears partial dentures     Past Surgical History:  Procedure Laterality Date   COLONOSCOPY     declines, will refer for Cologuard 09/2015   LEG  SURGERY  2009   right upper leg,necrotizing fascitis   NASAL SINUS SURGERY     age 9yo    Prior to Admission medications   Medication Sig Start Date End Date Taking? Authorizing Provider  augmented betamethasone  dipropionate (DIPROLENE -AF) 0.05 % ointment Apply topically 2 (two) times daily. 12/30/23   Billy Philippe SAUNDERS, NP  fluticasone  (CUTIVATE ) 0.005 % ointment Apply 1 Application topically 2 (two) times daily. 12/30/23   Billy Philippe SAUNDERS, NP  ketoconazole  (NIZORAL ) 2 % cream Apply 1 Application topically 2 (two) times daily. 12/31/23   Ozell Heron HERO, MD  Omeprazole  Magnesium (PRILOSEC PO) Take by mouth once a week.    [provider]  pravastatin  (PRAVACHOL ) 40 MG tablet Take 1 tablet (40 mg total) by mouth daily. 11/18/23   Billy Philippe SAUNDERS, NP    Family History  Problem Relation Age of Onset   Stroke Mother    Dementia Mother        vascular   Cancer Father 98       died of bladder cancer   Osteoporosis Sister    Stevens-Johnson syndrome Sister    Diabetes Neg Hx    Heart disease Neg Hx    Hypertension Neg Hx    Colon cancer Neg Hx    Colon polyps Neg Hx    Rectal cancer Neg Hx  Stomach cancer Neg Hx      Social History   Tobacco Use   Smoking status: Former    Current packs/day: 0.50    Average packs/day: 0.5 packs/day for 19.7 years (9.8 ttl pk-yrs)    Types: Cigarettes    Start date: 06/20/2004   Smokeless tobacco: Never  Vaping Use   Vaping status: Never Used  Substance Use Topics   Alcohol use: No    Alcohol/week: 0.0 standard drinks of alcohol   Drug use: No    Allergies as of 02/19/2024 - Review Complete 02/19/2024  Allergen Reaction Noted   Benzonatate   05/21/2021   Penicillins Rash 06/20/2012    Review of Systems:    All systems reviewed and negative except where noted in HPI.   Physical Exam:  BP 136/60 (BP Location: Left Arm, Patient Position: Sitting, Cuff Size: Normal)   Pulse 61   Ht 5' 7 (1.702 m)   Wt 181 lb 8  oz (82.3 kg)   BMI 28.43 kg/m  No LMP for male patient.  General:   Alert,  Well-developed, well-nourished, pleasant and cooperative in NAD Lungs:  Respirations even and unlabored.  Clear throughout to auscultation.   No wheezes, crackles, or rhonchi. No acute distress. Heart:  Regular rate and rhythm; no murmurs, clicks, rubs, or gallops. Abdomen:  Normal bowel sounds.  No bruits.  Soft, and non-distended without masses, hepatosplenomegaly or hernias noted.  No Tenderness.  No guarding or rebound tenderness.    Neurologic:  Alert and oriented x3;  grossly normal neurologically. Psych:  Alert and cooperative. Normal mood and affect.  Imaging Studies: No results found.  Labs: CBC    Component Value Date/Time   WBC 5.6 12/18/2023 0818   RBC 4.89 12/18/2023 0818   HGB 14.7 12/18/2023 0818   HCT 42.9 12/18/2023 0818   PLT 259.0 12/18/2023 0818   MCV 87.7 12/18/2023 0818   MCH 30.1 05/29/2018 1532   MCHC 34.3 12/18/2023 0818   RDW 13.1 12/18/2023 0818   LYMPHSABS 1.6 12/18/2023 0818   MONOABS 0.5 12/18/2023 0818   EOSABS 0.3 12/18/2023 0818   BASOSABS 0.0 12/18/2023 0818    CMP     Component Value Date/Time   NA 139 12/18/2023 0818   K 4.3 12/18/2023 0818   CL 103 12/18/2023 0818   CO2 29 12/18/2023 0818   GLUCOSE 91 12/18/2023 0818   BUN 15 12/18/2023 0818   CREATININE 1.05 12/18/2023 0818   CREATININE 1.01 05/29/2018 1532   CALCIUM  9.4 12/18/2023 0818   PROT 6.6 12/18/2023 0818   ALBUMIN 4.4 12/18/2023 0818   AST 25 12/18/2023 0818   ALT 24 12/18/2023 0818   ALKPHOS 63 12/18/2023 0818   BILITOT 0.7 12/18/2023 0818    Assessment and Plan:   Jared Coffey is a 68 y.o. y/o male has been referred for:  1.  Gastroesophageal reflux disease, chronic for 30 years.  Controlled on low-dose daily PPI.  Has recurrent acid reflux off PPI.  No previous EGD. - Continue Prilosec 20 Mg once daily. - Per patient request I am scheduling EGD to screen for Barrett's or  esophagitis. - Scheduling EGD I discussed risks of EGD with patient to include risk of bleeding, perforation, and risk of sedation.  Patient expressed understanding and agrees to proceed with EGD.  - Recommend Lifestyle Modifications to prevent Acid Reflux.  Rec. Avoid coffee, sodas, peppermint, garlic, onions, alcohol, citrus fruits, chocolate, tomatoes, fatty and spicey foods.  Avoid eating 2-3 hours before  bedtime.    2.  History of adenomatous colon polyps - 3-year repeat colonoscopy will be due 01/2026.   Follow up as needed based on EGD results and GI symptoms.  Ellouise Console, PA-C

## 2024-02-21 ENCOUNTER — Other Ambulatory Visit: Payer: Self-pay | Admitting: Family Medicine

## 2024-02-21 DIAGNOSIS — E785 Hyperlipidemia, unspecified: Secondary | ICD-10-CM

## 2024-03-12 DIAGNOSIS — L249 Irritant contact dermatitis, unspecified cause: Secondary | ICD-10-CM | POA: Diagnosis not present

## 2024-03-16 ENCOUNTER — Ambulatory Visit (INDEPENDENT_AMBULATORY_CARE_PROVIDER_SITE_OTHER): Payer: Medicare (Managed Care)

## 2024-03-16 VITALS — BP 120/60 | HR 61 | Temp 97.7°F | Ht 67.0 in | Wt 182.8 lb

## 2024-03-16 DIAGNOSIS — Z0000 Encounter for general adult medical examination without abnormal findings: Secondary | ICD-10-CM | POA: Diagnosis not present

## 2024-03-16 NOTE — Progress Notes (Signed)
 Subjective:   Jared Coffey is a 68 y.o. who presents for a Medicare Wellness preventive visit.  As a reminder, Annual Wellness Visits don't include a physical exam, and some assessments may be limited, especially if this visit is performed virtually. We may recommend an in-person follow-up visit with your provider if needed.  Visit Complete: In person    Persons Participating in Visit: Patient.  AWV Questionnaire: No: Patient Medicare AWV questionnaire was not completed prior to this visit.  Cardiac Risk Factors include: advanced age (>76men, >15 women);male gender     Objective:    Today's Vitals   03/16/24 1521  BP: 120/60  Pulse: 61  Temp: 97.7 F (36.5 C)  TempSrc: Oral  SpO2: 97%  Weight: 182 lb 12.8 oz (82.9 kg)  Height: 5' 7 (1.702 m)   Body mass index is 28.63 kg/m.     03/16/2024    3:42 PM 01/18/2024    9:19 PM 11/08/2022   10:22 AM 08/02/2022    9:22 AM 05/28/2021    9:14 PM 10/15/2016    8:51 PM 07/02/2014    2:38 PM  Advanced Directives  Does Patient Have a Medical Advance Directive? No No Yes No No No  Yes   Type of Tax inspector;Living will   Does patient want to make changes to medical advance directive?   Yes (MAU/Ambulatory/Procedural Areas - Information given)      Copy of Healthcare Power of Attorney in Chart?   No - copy requested      Would patient like information on creating a medical advance directive? No - Patient declined           Data saved with a previous flowsheet row definition    Current Medications (verified) Outpatient Encounter Medications as of 03/16/2024  Medication Sig   augmented betamethasone  dipropionate (DIPROLENE -AF) 0.05 % ointment Apply topically 2 (two) times daily.   Cholecalciferol 50 MCG (2000 UT) TABS Take 2,000 Units by mouth daily.   fluticasone  (CUTIVATE ) 0.005 % ointment Apply 1 Application topically 2 (two) times daily.   ketoconazole  (NIZORAL ) 2 % cream Apply 1  Application topically 2 (two) times daily.   Omeprazole  Magnesium (PRILOSEC PO) Take by mouth once a week.   pravastatin  (PRAVACHOL ) 40 MG tablet TAKE 1 TABLET(40 MG) BY MOUTH DAILY   No facility-administered encounter medications on file as of 03/16/2024.    Allergies (verified) Benzonatate  and Penicillins   History: Past Medical History:  Diagnosis Date   GERD (gastroesophageal reflux disease)    H/O necrotizing fasciitis 2009   right upper thigh   Hyperlipidemia    lower with diet changes   Wears glasses    Wears partial dentures    Past Surgical History:  Procedure Laterality Date   COLONOSCOPY     declines, will refer for Cologuard 09/2015   LEG SURGERY  2009   right upper leg,necrotizing fascitis   NASAL SINUS SURGERY     age 80yo   Family History  Problem Relation Age of Onset   Stroke Mother    Dementia Mother        vascular   Cancer Father 19       died of bladder cancer   Osteoporosis Sister    Stevens-Johnson syndrome Sister    Diabetes Neg Hx    Heart disease Neg Hx    Hypertension Neg Hx    Colon cancer Neg Hx    Colon  polyps Neg Hx    Rectal cancer Neg Hx    Stomach cancer Neg Hx    Social History   Socioeconomic History   Marital status: Married    Spouse name: Not on file   Number of children: 2   Years of education: Not on file   Highest education level: Not on file  Occupational History   Occupation: Tour [employer]  Tobacco Use   Smoking status: Former    Current packs/day: 0.50    Average packs/day: 0.5 packs/day for 19.7 years (9.9 ttl pk-yrs)    Types: Cigarettes    Start date: 06/20/2004   Smokeless tobacco: Never  Vaping Use   Vaping status: Never Used  Substance and Sexual Activity   Alcohol use: No    Alcohol/week: 0.0 standard drinks of alcohol   Drug use: No   Sexual activity: Not Currently  Other Topics Concern   Not on file  Social History Narrative      Social Drivers of Health   Financial Resource Strain: Low Risk   (03/16/2024)   Overall Financial Resource Strain (CARDIA)    Difficulty of Paying Living Expenses: Not hard at all  Food Insecurity: No Food Insecurity (03/16/2024)   Hunger Vital Sign    Worried About Running Out of Food in the Last Year: Never true    Ran Out of Food in the Last Year: Never true  Transportation Needs: No Transportation Needs (03/16/2024)   PRAPARE - Administrator, Civil Service (Medical): No    Lack of Transportation (Non-Medical): No  Physical Activity: Sufficiently Active (03/16/2024)   Exercise Vital Sign    Days of Exercise per Week: 6 days    Minutes of Exercise per Session: 60 min  Stress: No Stress Concern Present (03/16/2024)   [employer] of Occupational Health - Occupational Stress Questionnaire    Feeling of Stress: Not at all  Social Connections: Moderately Isolated (03/16/2024)   Social Connection and Isolation Panel    Frequency of Communication with Friends and Family: More than three times a week    Frequency of Social Gatherings with Friends and Family: More than three times a week    Attends Religious Services: Never    Database administrator or Organizations: No    Attends Engineer, structural: Never    Marital Status: Married    Tobacco Counseling Counseling given: Not Answered    Clinical Intake:  Pre-visit preparation completed: Yes  Pain : No/denies pain     BMI - recorded: 28.63 Nutritional Status: BMI 25 -29 Overweight Nutritional Risks: None Diabetes: No  Lab Results  Component Value Date   HGBA1C 5.5 10/17/2021   HGBA1C 5.4 09/30/2019   HGBA1C 5.6 08/08/2018     How often do you need to have someone help you when you read instructions, pamphlets, or other written materials from your doctor or pharmacy?: 1 - Never  Interpreter Needed?: No  Information entered by :: Rojelio Blush LPN   Activities of Daily Living     03/16/2024    3:40 PM  In your present state of health, do you have any  difficulty performing the following activities:  Hearing? 0  Vision? 0  Difficulty concentrating or making decisions? 0  Walking or climbing stairs? 0  Dressing or bathing? 0  Doing errands, shopping? 0  Preparing Food and eating ? N  Using the Toilet? N  In the past six months, have you accidently leaked urine? N  Do you have problems with loss of bowel control? N  Managing your Medications? N  Managing your Finances? N  Housekeeping or managing your Housekeeping? N    Patient Care Team: Billy Philippe SAUNDERS, NP as PCP - General (Family Medicine) Elnor Rome BROCKS, MD as Referring Physician (Specialist)  I have updated your Care Teams any recent Medical Services you may have received from other providers in the past year.     Assessment:   This is a routine wellness examination for Rangel.  Hearing/Vision screen Hearing Screening - Comments:: Denies hearing difficulties   Vision Screening - Comments:: Wears rx glasses - up to date with routine eye exams with  [facility]   Goals Addressed               This Visit's Progress     Remain active (pt-stated)         Depression Screen     03/16/2024    3:23 PM 12/18/2023    7:08 AM 09/30/2023    2:01 PM 05/21/2023    7:49 AM 11/07/2022    2:36 PM 10/17/2021   10:01 AM 10/05/2021   11:59 AM  PHQ 2/9 Scores  PHQ - 2 Score 0 0 0 0 0 0 0  PHQ- 9 Score  0  0 0 0 0    Fall Risk     03/16/2024    3:42 PM 12/18/2023    7:08 AM 09/30/2023    2:01 PM 05/21/2023    7:48 AM 11/08/2022   10:29 AM  Fall Risk   Falls in the past year? 0 0 0 0 0  Number falls in past yr: 0 0 0 0 0  Injury with Fall? 0 0 0 0 0  Risk for fall due to : No Fall Risks No Fall Risks No Fall Risks No Fall Risks No Fall Risks  Follow up Falls evaluation completed Falls evaluation completed Falls evaluation completed Falls evaluation completed     MEDICARE RISK AT HOME:  Medicare Risk at Home Any stairs in or around the home?: Yes If so, are there any  without handrails?: No Home free of loose throw rugs in walkways, pet beds, electrical cords, etc?: Yes Adequate lighting in your home to reduce risk of falls?: Yes Life alert?: No Use of a cane, walker or w/c?: No Grab bars in the bathroom?: Yes Shower chair or bench in shower?: No Elevated toilet seat or a handicapped toilet?: Yes  TIMED UP AND GO:  Was the test performed?  Yes  Length of time to ambulate 10 feet: 10 sec Gait steady and fast without use of assistive device  Cognitive Function: 6CIT completed    11/08/2022   10:32 AM  MMSE - Mini Mental State Exam  Orientation to time 4  Orientation to Place 4  Registration 3  Attention/ Calculation 5  Recall 2  Language- name 2 objects 2  Language- repeat 1  Language- follow 3 step command 3  Language- read & follow direction 1  Write a sentence 1  Copy design 1  Total score 27        03/16/2024    3:42 PM  6CIT Screen  What Year? 0 points  What month? 0 points  What time? 0 points  Count back from 20 0 points  Months in reverse 0 points  Repeat phrase 0 points  Total Score 0 points    Immunizations Immunization History  Administered Date(s) Administered   Hepatitis  B 10/28/2012, 11/27/2012   Hepatitis B, ADULT 04/30/2013   Influenza, High Dose Seasonal PF 10/05/2020   Influenza,inj,Quad PF,6+ Mos 05/25/2014, 08/30/2016   Influenza-Unspecified 04/16/2022, 05/14/2023   PFIZER Comirnaty(Gray Top)Covid-19 Tri-Sucrose Vaccine 10/23/2019, 11/16/2019, 05/26/2020   PFIZER(Purple Top)SARS-COV-2 Vaccination 10/23/2019, 11/16/2019, 05/26/2020   PNEUMOCOCCAL CONJUGATE-20 11/22/2022   PPD Test 10/13/2012, 10/28/2012, 06/28/2014   Pfizer Covid-19 Vaccine Bivalent Booster 58yrs & up 10/05/2021, 01/15/2022   Pneumococcal Polysaccharide-23 10/05/2020   RSV,unspecified 07/31/2022   Respiratory Syncytial Virus Vaccine,Recomb Aduvanted(Arexvy) 07/31/2022   Tdap 10/13/2012, 10/05/2021   Unspecified SARS-COV-2 Vaccination  04/16/2022   Zoster Recombinant(Shingrix ) 11/16/2016, 03/06/2017    Screening Tests Health Maintenance  Topic Date Due   COVID-19 Vaccine (10 - 2024-25 season) 03/31/2023   INFLUENZA VACCINE  02/28/2024   Medicare Annual Wellness (AWV)  03/16/2025   Colonoscopy  02/18/2026   DTaP/Tdap/Td (3 - Td or Tdap) 10/06/2031   Pneumococcal Vaccine: 50+ Years  Completed   Hepatitis C Screening  Completed   Zoster Vaccines- Shingrix   Completed   HPV VACCINES  Aged Out   Meningococcal B Vaccine  Aged Out   Hepatitis B Vaccines 19-59 Average Risk  Discontinued   Fecal DNA (Cologuard)  Discontinued    Health Maintenance  Health Maintenance Due  Topic Date Due   COVID-19 Vaccine (10 - 2024-25 season) 03/31/2023   INFLUENZA VACCINE  02/28/2024   Health Maintenance Items Addressed:   Additional Screening:  Vision Screening: Recommended annual ophthalmology exams for early detection of glaucoma and other disorders of the eye. Would you like a referral to an eye doctor? No    Dental Screening: Recommended annual dental exams for proper oral hygiene  Community Resource Referral / Chronic Care Management: CRR required this visit?  No   CCM required this visit?  No   Plan:    I have personally reviewed and noted the following in the patient's chart:   Medical and social history Use of alcohol, tobacco or illicit drugs  Current medications and supplements including opioid prescriptions. Patient is not currently taking opioid prescriptions. Functional ability and status Nutritional status Physical activity Advanced directives List of other physicians Hospitalizations, surgeries, and ER visits in previous 12 months Vitals Screenings to include cognitive, depression, and falls Referrals and appointments  In addition, I have reviewed and discussed with patient certain preventive protocols, quality metrics, and best practice recommendations. A written personalized care plan for  preventive services as well as general preventive health recommendations were provided to patient.   Rojelio LELON Blush, LPN   [id number]/81/[id number]   After Visit Summary: (In Person-Printed) AVS printed and given to the patient  Notes: Nothing significant to report at this time.

## 2024-03-16 NOTE — Patient Instructions (Signed)
 Mr. Jared Coffey , Thank you for taking time out of your busy schedule to complete your Annual Wellness Visit with me. I enjoyed our conversation and look forward to speaking with you again next year. I, as well as your care team,  appreciate your ongoing commitment to your health goals. Please review the following plan we discussed and let me know if I can assist you in the future. Your Game plan/ To Do List    Referrals: If you haven't heard from the office you've been referred to, please reach out to them at the phone provided.   Follow up Visits: We will see or speak with you next year for your Next Medicare AWV with our clinical staff 03/22/25 @ 340p  Have you seen your provider in the last 6 months (3 months if uncontrolled diabetes)?   Clinician Recommendations:  Aim for 30 minutes of exercise or brisk walking, 6-8 glasses of water, and 5 servings of fruits and vegetables each day.       This is a list of the screenings recommended for you:  Health Maintenance  Topic Date Due   COVID-19 Vaccine (10 - 2024-25 season) 03/31/2023   Flu Shot  02/28/2024   Medicare Annual Wellness Visit  03/16/2025   Colon Cancer Screening  02/18/2026   DTaP/Tdap/Td vaccine (3 - Td or Tdap) 10/06/2031   Pneumococcal Vaccine for age over 58  Completed   Hepatitis C Screening  Completed   Zoster (Shingles) Vaccine  Completed   HPV Vaccine  Aged Out   Meningitis B Vaccine  Aged Out   Hepatitis B Vaccine  Discontinued   Cologuard (Stool DNA test)  Discontinued    Advanced directives: (Declined) Advance directive discussed with you today. Even though you declined this today, please call our office should you change your mind, and we can give you the proper paperwork for you to fill out. Advance Care Planning is important because it:  [x]  Makes sure you receive the medical care that is consistent with your values, goals, and preferences  [x]  It provides guidance to your family and loved ones and reduces  their decisional burden about whether or not they are making the right decisions based on your wishes.  Follow the link provided in your after visit summary or read over the paperwork we have mailed to you to help you started getting your Advance Directives in place. If you need assistance in completing these, please reach out to us  so that we can help you!  See attachments for Preventive Care and Fall Prevention Tips.

## 2024-04-23 ENCOUNTER — Encounter: Payer: Self-pay | Admitting: Internal Medicine

## 2024-04-28 ENCOUNTER — Telehealth: Payer: Self-pay | Admitting: Internal Medicine

## 2024-04-28 NOTE — Telephone Encounter (Signed)
 Good Afternoon Dr.Pyrtle,  Patient calling wanting to cancel upcoming  EGD procedure on 04/30/24 at 7:30am due to wife not being able to bring him since she recently had surgery. Offered patient [facility] but refused, stated he will call back to reschedule once his wife recovers and can bring him.  Please advise  Thank you

## 2024-04-29 NOTE — Telephone Encounter (Signed)
 Noted! Thank you

## 2024-04-30 ENCOUNTER — Encounter: Payer: Medicare (Managed Care) | Admitting: Internal Medicine

## 2024-07-14 ENCOUNTER — Telehealth: Payer: Self-pay

## 2024-07-14 NOTE — Progress Notes (Signed)
° °  07/14/2024  Patient ID: Jared Coffey, male   DOB: [birth year], 68 y.o.   MRN: [id number]  Pharmacy Quality Measure Review  This patient is appearing on a report for being at risk of failing the adherence measure for cholesterol (statin) medications this calendar year.   Medication: Pravastatin  Last fill date: 02/16/24 for 90 day supply  Contacted pharmacy to facilitate refills. Patient aware and plans to pick up.  Jon VEAR [place], PharmD Clinical Pharmacist [phone]

## 2024-12-23 ENCOUNTER — Encounter: Payer: Medicare (Managed Care) | Admitting: Family Medicine

## 2025-03-22 ENCOUNTER — Ambulatory Visit: Payer: Medicare (Managed Care)
